# Patient Record
Sex: Female | Born: 1960 | Race: White | Hispanic: No | Marital: Married | Smoking: Former smoker
Health system: Southern US, Community
[De-identification: ages and names within clinical notes are randomized; demographics above are authoritative.]

## PROBLEM LIST (undated history)

## (undated) DIAGNOSIS — R51 Headache: Secondary | ICD-10-CM

## (undated) DIAGNOSIS — E041 Nontoxic single thyroid nodule: Secondary | ICD-10-CM

## (undated) DIAGNOSIS — E7800 Pure hypercholesterolemia, unspecified: Secondary | ICD-10-CM

## (undated) DIAGNOSIS — K259 Gastric ulcer, unspecified as acute or chronic, without hemorrhage or perforation: Secondary | ICD-10-CM

## (undated) DIAGNOSIS — K219 Gastro-esophageal reflux disease without esophagitis: Secondary | ICD-10-CM

## (undated) DIAGNOSIS — Z8719 Personal history of other diseases of the digestive system: Secondary | ICD-10-CM

## (undated) DIAGNOSIS — F419 Anxiety disorder, unspecified: Secondary | ICD-10-CM

## (undated) HISTORY — PX: KYPHOPLASTY: SHX5884

---

## 1999-07-20 ENCOUNTER — Emergency Department (HOSPITAL_COMMUNITY): Admission: EM | Admit: 1999-07-20 | Discharge: 1999-07-20 | Payer: Self-pay | Admitting: Internal Medicine

## 1999-07-20 ENCOUNTER — Encounter: Payer: Self-pay | Admitting: Internal Medicine

## 1999-08-04 ENCOUNTER — Encounter: Payer: Self-pay | Admitting: Internal Medicine

## 1999-08-04 ENCOUNTER — Ambulatory Visit (HOSPITAL_COMMUNITY): Admission: RE | Admit: 1999-08-04 | Discharge: 1999-08-04 | Payer: Self-pay | Admitting: Internal Medicine

## 1999-08-04 ENCOUNTER — Emergency Department (HOSPITAL_COMMUNITY): Admission: EM | Admit: 1999-08-04 | Discharge: 1999-08-04 | Payer: Self-pay | Admitting: Emergency Medicine

## 2000-06-08 ENCOUNTER — Other Ambulatory Visit: Admission: RE | Admit: 2000-06-08 | Discharge: 2000-06-08 | Payer: Self-pay | Admitting: Obstetrics and Gynecology

## 2000-06-09 ENCOUNTER — Other Ambulatory Visit: Admission: RE | Admit: 2000-06-09 | Discharge: 2000-06-09 | Payer: Self-pay | Admitting: Obstetrics and Gynecology

## 2000-06-09 ENCOUNTER — Encounter (INDEPENDENT_AMBULATORY_CARE_PROVIDER_SITE_OTHER): Payer: Self-pay | Admitting: Specialist

## 2000-06-16 ENCOUNTER — Ambulatory Visit (HOSPITAL_COMMUNITY): Admission: RE | Admit: 2000-06-16 | Discharge: 2000-06-16 | Payer: Self-pay | Admitting: Obstetrics and Gynecology

## 2000-06-16 ENCOUNTER — Encounter: Payer: Self-pay | Admitting: Obstetrics and Gynecology

## 2002-12-19 ENCOUNTER — Other Ambulatory Visit: Admission: RE | Admit: 2002-12-19 | Discharge: 2002-12-19 | Payer: Self-pay | Admitting: Obstetrics and Gynecology

## 2005-05-03 ENCOUNTER — Ambulatory Visit (HOSPITAL_COMMUNITY): Admission: RE | Admit: 2005-05-03 | Discharge: 2005-05-03 | Payer: Self-pay | Admitting: Obstetrics and Gynecology

## 2005-08-18 ENCOUNTER — Other Ambulatory Visit: Admission: RE | Admit: 2005-08-18 | Discharge: 2005-08-18 | Payer: Self-pay | Admitting: Obstetrics and Gynecology

## 2005-09-16 ENCOUNTER — Encounter: Admission: RE | Admit: 2005-09-16 | Discharge: 2005-09-16 | Payer: Self-pay | Admitting: Obstetrics and Gynecology

## 2006-03-30 ENCOUNTER — Emergency Department (HOSPITAL_COMMUNITY): Admission: EM | Admit: 2006-03-30 | Discharge: 2006-03-30 | Payer: Self-pay | Admitting: Emergency Medicine

## 2007-09-17 ENCOUNTER — Ambulatory Visit (HOSPITAL_COMMUNITY): Admission: RE | Admit: 2007-09-17 | Discharge: 2007-09-17 | Payer: Self-pay | Admitting: Obstetrics and Gynecology

## 2007-09-27 ENCOUNTER — Encounter: Admission: RE | Admit: 2007-09-27 | Discharge: 2007-09-27 | Payer: Self-pay

## 2007-10-04 ENCOUNTER — Encounter: Admission: RE | Admit: 2007-10-04 | Discharge: 2007-10-04 | Payer: Self-pay | Admitting: Internal Medicine

## 2008-09-26 ENCOUNTER — Encounter: Admission: RE | Admit: 2008-09-26 | Discharge: 2008-09-26 | Payer: Self-pay | Admitting: Internal Medicine

## 2009-08-22 ENCOUNTER — Emergency Department (HOSPITAL_COMMUNITY): Admission: EM | Admit: 2009-08-22 | Discharge: 2009-08-22 | Payer: Self-pay | Admitting: Emergency Medicine

## 2010-06-27 DIAGNOSIS — E041 Nontoxic single thyroid nodule: Secondary | ICD-10-CM

## 2010-06-27 HISTORY — DX: Nontoxic single thyroid nodule: E04.1

## 2010-06-27 HISTORY — PX: OTHER SURGICAL HISTORY: SHX169

## 2010-09-01 ENCOUNTER — Other Ambulatory Visit (INDEPENDENT_AMBULATORY_CARE_PROVIDER_SITE_OTHER): Payer: Self-pay | Admitting: Otolaryngology

## 2010-09-01 DIAGNOSIS — R221 Localized swelling, mass and lump, neck: Secondary | ICD-10-CM

## 2010-09-03 ENCOUNTER — Ambulatory Visit
Admission: RE | Admit: 2010-09-03 | Discharge: 2010-09-03 | Disposition: A | Discharge status: Home / Self Care | Payer: BC Managed Care – PPO | Source: Ambulatory Visit | Attending: Otolaryngology | Admitting: Otolaryngology

## 2010-09-03 DIAGNOSIS — R221 Localized swelling, mass and lump, neck: Secondary | ICD-10-CM

## 2010-09-03 MED ORDER — IOHEXOL 300 MG/ML  SOLN
75.0000 mL | Freq: Once | INTRAMUSCULAR | Status: AC | PRN
  Administered 2010-09-03: 75 mL via INTRAVENOUS

## 2010-11-24 ENCOUNTER — Other Ambulatory Visit (INDEPENDENT_AMBULATORY_CARE_PROVIDER_SITE_OTHER): Payer: Self-pay | Admitting: Otolaryngology

## 2010-11-24 DIAGNOSIS — E041 Nontoxic single thyroid nodule: Secondary | ICD-10-CM

## 2010-12-01 ENCOUNTER — Ambulatory Visit
Admission: RE | Admit: 2010-12-01 | Discharge: 2010-12-01 | Disposition: A | Discharge status: Home / Self Care | Payer: BC Managed Care – PPO | Source: Ambulatory Visit | Attending: Otolaryngology | Admitting: Otolaryngology

## 2010-12-01 DIAGNOSIS — E041 Nontoxic single thyroid nodule: Secondary | ICD-10-CM

## 2011-02-10 ENCOUNTER — Ambulatory Visit (HOSPITAL_BASED_OUTPATIENT_CLINIC_OR_DEPARTMENT_OTHER)
Admission: RE | Admit: 2011-02-10 | Discharge: 2011-02-10 | Disposition: A | Discharge status: Home / Self Care | Payer: BC Managed Care – PPO | Source: Ambulatory Visit | Attending: Urology | Admitting: Urology

## 2011-02-10 DIAGNOSIS — N393 Stress incontinence (female) (male): Secondary | ICD-10-CM | POA: Insufficient documentation

## 2011-02-10 DIAGNOSIS — Z01812 Encounter for preprocedural laboratory examination: Secondary | ICD-10-CM | POA: Insufficient documentation

## 2011-02-10 LAB — CBC
Hemoglobin: 13.1 g/dL (ref 12.0–15.0)
MCHC: 33.5 g/dL (ref 30.0–36.0)
RDW: 13.7 % (ref 11.5–15.5)

## 2011-02-10 LAB — TYPE AND SCREEN
ABO/RH(D): A POS
Antibody Screen: NEGATIVE

## 2011-02-18 NOTE — Op Note (Signed)
  NAMELORREN, ROSSETTI NO.:  [id number]  MEDICAL RECORD NO.:  [id number]  LOCATION:                                 FACILITY:  PHYSICIAN:  Martina Sinner, MD      DATE OF BIRTH:  DATE OF PROCEDURE:  02/10/2011 DATE OF DISCHARGE:                              OPERATIVE REPORT   PREOPERATIVE DIAGNOSIS:  Stress urinary incontinence.  POSTOPERATIVE DIAGNOSIS:  Stress urinary incontinence.  SURGERY:  Sling [facility]) cystourethropexy plus cystoscopy.  Ms. Decamp has stress urinary incontinence affecting quality of life. She consented to the above procedure.  She was given preoperative antibiotics.  Preoperative laboratory tests were normal.  Extra care was taken with leg positioning to minimize the risk compartment syndrome, neuropathy, and DVT.  Two 1 cm incisions were made, 1 fingerbreadth above the symphysis pubis, and a skin fold 1.5 cm lateral to the midline.  I made a 2 cm appropriate depth suburethral incision after instilling 5 cc of lidocaine epinephrine mixture.  I sharply dissected urethrovesical angle bilaterally.  With the bladder emptied, I passed a SPARC needle on top of along the back of symphysis pubis using my box technique.  I delivered the trocars, noted a popping index finger bilaterally.  I cystoscoped the patient.  There was no injury to bladder or urethra. There was excellent blue jets bilaterally.  There was no wiggling of bladder with wiggling of the trocar.  With the bladder emptied, I attached the SPARC sling and brought up to the retropubic space and tensioned over the fat with a moderate-sized Kelly clamp.  I cut below the dots and removed the sheaths.  I was very happy with position and tension of the sling.  There was appropriate hypermobility and no spring back effect.  All incisions were irrigated.  I closed the anterior vaginal wall with running 2-0 Vicryl followed by 2 interrupted sutures.  There was a little bit  of splitting of the incision to the patient's left side near the urethral meatus and this was closed with interrupted 2-0 Vicryl. She had healthy tissue and a nice thick closure.  Sling was cut at the below skin level and closed with 4-0 Vicryl interrupted suture and Dermabond.  Hopefully, this operation will reach Ms. Pollie Friar treatment goal.          ______________________________ Martina Sinner, MD     SAM/MEDQ  D:  02/10/2011  T:  02/11/2011  Job:  [id number]  Electronically Signed by Alfredo Martinez MD on 02/18/2011 08:19:50 AM

## 2011-03-21 ENCOUNTER — Other Ambulatory Visit: Payer: Self-pay | Admitting: Internal Medicine

## 2011-03-21 DIAGNOSIS — E041 Nontoxic single thyroid nodule: Secondary | ICD-10-CM

## 2011-10-31 ENCOUNTER — Ambulatory Visit
Admission: RE | Admit: 2011-10-31 | Discharge: 2011-10-31 | Disposition: A | Discharge status: Home / Self Care | Payer: BC Managed Care – PPO | Source: Ambulatory Visit | Attending: Internal Medicine | Admitting: Internal Medicine

## 2011-10-31 DIAGNOSIS — E041 Nontoxic single thyroid nodule: Secondary | ICD-10-CM

## 2011-11-22 ENCOUNTER — Ambulatory Visit (HOSPITAL_COMMUNITY)
Admission: RE | Admit: 2011-11-22 | Discharge: 2011-11-22 | Disposition: A | Discharge status: Home / Self Care | Payer: BC Managed Care – PPO | Source: Ambulatory Visit | Attending: Gastroenterology | Admitting: Gastroenterology

## 2011-11-22 ENCOUNTER — Encounter (HOSPITAL_COMMUNITY): Payer: Self-pay

## 2011-11-22 HISTORY — DX: Headache: R51

## 2011-11-22 HISTORY — DX: Pure hypercholesterolemia, unspecified: E78.00

## 2011-11-22 HISTORY — DX: Nontoxic single thyroid nodule: E04.1

## 2011-11-22 HISTORY — DX: Personal history of other diseases of the digestive system: Z87.19

## 2011-11-22 HISTORY — DX: Anxiety disorder, unspecified: F41.9

## 2011-11-22 HISTORY — DX: Gastric ulcer, unspecified as acute or chronic, without hemorrhage or perforation: K25.9

## 2011-11-22 HISTORY — DX: Gastro-esophageal reflux disease without esophagitis: K21.9

## 2011-11-22 NOTE — Anesthesia Preprocedure Evaluation (Addendum)
Anesthesia Evaluation    Airway Mallampati: II TM Distance: >3 FB Neck ROM: Full    Dental No notable dental hx.    Pulmonary  breath sounds clear to auscultation  Pulmonary exam normal       Cardiovascular Rhythm:Regular Rate:Normal     Neuro/Psych PSYCHIATRIC DISORDERS Anxiety    GI/Hepatic hiatal hernia, GERD-  Medicated and Controlled,  Endo/Other    Renal/GU      Musculoskeletal   Abdominal   Peds  Hematology   Anesthesia Other Findings   Reproductive/Obstetrics                           Anesthesia Physical Anesthesia Plan  ASA: II  Anesthesia Plan: MAC   Post-op Pain Management:    Induction: Intravenous  Airway Management Planned:   Additional Equipment:   Intra-op Plan:   Post-operative Plan:   Informed Consent: I have reviewed the patients History and Physical, chart, labs and discussed the procedure including the risks, benefits and alternatives for the proposed anesthesia with the patient or authorized representative who has indicated his/her understanding and acceptance.   Dental advisory given  Plan Discussed with:   Anesthesia Plan Comments:         Anesthesia Quick Evaluation

## 2011-11-23 ENCOUNTER — Encounter (HOSPITAL_COMMUNITY): Payer: Self-pay | Admitting: *Deleted

## 2011-11-23 ENCOUNTER — Encounter (HOSPITAL_COMMUNITY): Payer: Self-pay | Admitting: Anesthesiology

## 2011-11-23 ENCOUNTER — Ambulatory Visit (HOSPITAL_COMMUNITY)
Admission: RE | Admit: 2011-11-23 | Discharge: 2011-11-23 | Disposition: A | Discharge status: Home / Self Care | Payer: BC Managed Care – PPO | Source: Ambulatory Visit | Attending: Gastroenterology | Admitting: Gastroenterology

## 2011-11-23 ENCOUNTER — Ambulatory Visit (HOSPITAL_COMMUNITY): Payer: BC Managed Care – PPO | Admitting: Anesthesiology

## 2011-11-23 ENCOUNTER — Encounter (HOSPITAL_COMMUNITY)
Admission: RE | Disposition: A | Discharge status: Home / Self Care | Payer: Self-pay | Source: Ambulatory Visit | Attending: Gastroenterology

## 2011-11-23 DIAGNOSIS — Z860100 Personal history of colon polyps, unspecified: Secondary | ICD-10-CM | POA: Insufficient documentation

## 2011-11-23 DIAGNOSIS — Z09 Encounter for follow-up examination after completed treatment for conditions other than malignant neoplasm: Secondary | ICD-10-CM | POA: Insufficient documentation

## 2011-11-23 HISTORY — PX: HOT HEMOSTASIS: SHX5433

## 2011-11-23 SURGERY — COLONOSCOPY WITH PROPOFOL
Anesthesia: Monitor Anesthesia Care

## 2011-11-23 MED ORDER — PROPOFOL 10 MG/ML IV EMUL
INTRAVENOUS | Status: DC | PRN
  Administered 2011-11-23: 100 ug/kg/min via INTRAVENOUS

## 2011-11-23 MED ORDER — KETAMINE HCL 10 MG/ML IJ SOLN
INTRAMUSCULAR | Status: DC | PRN
  Administered 2011-11-23: 10 mg via INTRAVENOUS

## 2011-11-23 MED ORDER — LACTATED RINGERS IV SOLN
INTRAVENOUS | Status: DC
  Administered 2011-11-23: 1000 mL via INTRAVENOUS

## 2011-11-23 MED ORDER — LACTATED RINGERS IV SOLN
INTRAVENOUS | Status: DC | PRN
  Administered 2011-11-23: 08:00:00 via INTRAVENOUS

## 2011-11-23 MED ORDER — FENTANYL CITRATE 0.05 MG/ML IJ SOLN
INTRAMUSCULAR | Status: DC | PRN
  Administered 2011-11-23: 50 ug via INTRAVENOUS

## 2011-11-23 MED ORDER — MIDAZOLAM HCL 5 MG/5ML IJ SOLN
INTRAMUSCULAR | Status: DC | PRN
  Administered 2011-11-23: 2 mg via INTRAVENOUS

## 2011-11-23 SURGICAL SUPPLY — 22 items

## 2011-11-23 NOTE — Preoperative (Signed)
Beta Blockers   Reason not to administer Beta Blockers:Not Applicable 

## 2011-11-23 NOTE — Anesthesia Postprocedure Evaluation (Signed)
  Anesthesia Post-op Note  Patient: Audrey Serrano  Procedure(s) Performed: Procedure(s) (LRB): COLONOSCOPY WITH PROPOFOL (N/A) HOT HEMOSTASIS (ARGON PLASMA COAGULATION/BICAP) (N/A)  Patient Location: PACU  Anesthesia Type: MAC  Level of Consciousness: awake and alert   Airway and Oxygen Therapy: Patient Spontanous Breathing  Post-op Pain: mild  Post-op Assessment: Post-op Vital signs reviewed, Patient's Cardiovascular Status Stable, Respiratory Function Stable, Patent Airway and No signs of Nausea or vomiting  Post-op Vital Signs: stable  Complications: No apparent anesthesia complications

## 2011-11-23 NOTE — Transfer of Care (Signed)
Immediate Anesthesia Transfer of Care Note  Patient: Audrey Serrano  Procedure(s) Performed: Procedure(s) (LRB): COLONOSCOPY WITH PROPOFOL (N/A) HOT HEMOSTASIS (ARGON PLASMA COAGULATION/BICAP) (N/A)  Patient Location: PACU  Anesthesia Type: MAC  Level of Consciousness: awake, alert , oriented, patient cooperative and responds to stimulation  Airway & Oxygen Therapy: Patient Spontanous Breathing and Patient connected to face mask oxygen  Post-op Assessment: Report given to PACU RN, Post -op Vital signs reviewed and stable and Patient moving all extremities X 4  Post vital signs: Reviewed and stable  Complications: No apparent anesthesia complications

## 2011-11-23 NOTE — H&P (Signed)
Patient interval history reviewed.  Patient examined again.  There has been no change from documented H/P dated 11/22/11 (scanned into chart from our office) except as documented above.  Assessment:  1.  Removal of large adenomatous polyp (ascending colon) during screening colonoscopy November 2012.  Plan:  1.  Colonoscopy (with propofol, for more deep sedation) to reinspect polypectomy site, and perform completion polypectomy and/or argon plasma coagulation (APC) to polypectomy site, as needed. 2.  Risks (bleeding, infection, bowel perforation that could require surgery, sedation-related changes in cardiopulmonary systems), benefits (identification and possible treatment of source of symptoms, exclusion of certain causes of symptoms), and alternatives (watchful waiting, radiographic imaging studies, empiric medical treatment) of colonoscopy with possible polypectomy and/or APC were explained to patient + husband in detail and patient wishes to proceed.

## 2011-11-23 NOTE — Discharge Instructions (Signed)
Colonoscopy ° °Post procedure instructions: ° °Read the instructions outlined below and refer to this sheet in the next few weeks. These discharge instructions provide you with general information on caring for yourself after you leave the hospital. Your doctor may also give you specific instructions. While your treatment has been planned according to the most current medical practices available, unavoidable complications occasionally occur. If you have any problems or questions after discharge, call Dr. Fitzhugh Vizcarrondo at [facility] ([phone]). ° °HOME CARE INSTRUCTIONS ° °ACTIVITY: °· You may resume your regular activity, but move at a slower pace for the next 24 hours.  °· Take frequent rest periods for the next 24 hours.  °· Walking will help get rid of the air and reduce the bloated feeling in your belly (abdomen).  °· No driving for 24 hours (because of the medicine (anesthesia) used during the test).  °· You may shower.  °· Do not sign any important legal documents or operate any machinery for 24 hours (because of the anesthesia used during the test).  °NUTRITION: °· Drink plenty of fluids.  °· You may resume your normal diet as instructed by your doctor.  °· Begin with a light meal and progress to your normal diet. Heavy or fried foods are harder to digest and may make you feel sick to your stomach (nauseated).  °· Avoid alcoholic beverages for 24 hours or as instructed.  °MEDICATIONS: °· You may resume your normal medications unless your doctor tells you otherwise.  °WHAT TO EXPECT TODAY: °· Some feelings of bloating in the abdomen.  °· Passage of more gas than usual.  °· Spotting of blood in your stool or on the toilet paper.  °IF YOU HAD POLYPS REMOVED DURING THE COLONOSCOPY: °· No aspirin products for 7 days or as instructed.  °· No alcohol for 7 days or as instructed.  °· Eat a soft diet for the next 24 hours.  ° °FINDING OUT THE RESULTS OF YOUR TEST ° °Not all test results are available during your  visit. If your test results are not back during the visit, make an appointment with your caregiver to find out the results. Do not assume everything is normal if you have not heard from your caregiver or the medical facility. It is important for you to follow up on all of your test results.  ° ° ° °SEEK IMMEDIATE MEDICAL CARE IF: ° °· You have more than a spotting of blood in your stool.  °· Your belly is swollen (abdominal distention).  °· You are nauseated or vomiting.  °· You have a fever.  °· You have abdominal pain or discomfort that is severe or gets worse throughout the day.  ° ° °Document Released: 01/26/2004 Document Revised: 02/23/2011 Document Reviewed: 01/24/2008 °[employer]® Patient Information ©2012 [employer]. ° °

## 2011-11-23 NOTE — Op Note (Signed)
[facility] [address] [place], [place]  [address]  COLONOSCOPY PROCEDURE REPORT  PATIENT:  Audrey Serrano, Audrey Serrano  MR#:  [id number] BIRTHDATE:  11-01-1960, 51 yrs. old  GENDER:  female ENDOSCOPIST:  Willis Modena, MD REF. BY:  Soyla Murphy. Renne Crigler, M.D. PROCEDURE DATE:  11/23/2011 PROCEDURE:  Colonoscopy 19147 ASA CLASS:  Class II INDICATIONS:  follow-up polypectomy site large polyp (211.3) MEDICATIONS:   MAC sedation, administered by CRNA DESCRIPTION OF PROCEDURE:   After the risks benefits and alternatives of the procedure were thoroughly explained, informed consent was obtained.  Digital rectal exam was performed and revealed no abnormalities.   The Pentax Ped Colon [id number] endoscope was introduced through the anus and advanced to the cecum, which was identified by both the appendix and ileocecal valve, without limitations.  The quality of the prep was good.. The instrument was then slowly withdrawn as the colon was fully examined.<<PROCEDUREIMAGES>>  FINDINGS:  Digital rectal exam normal.  Prep quality good. Ileocecal valve, cecal strap, and appendiceal orifice were normal. Tattoo in ascending colon from prior large polypectomy  site noted; no residual polyp tissue was identified.  No other polyps, masses, vascular ectasias, or inflammatory changes were seen. Normal retroflexed view of rectum.  ENDOSCOPIC IMPRESSION:    1.  Tattoo marking, but no residual polyp tissue seen from prior large polypectomy site in ascending colon. 2.  Otherwise normal colonoscopy.  RECOMMENDATIONS:      1.  Watch for potential complications of procedure. 2.  Repeat colonoscopy in three (3) years for polyp surveillance. 3.  Follow-up with [place] in office on as-needed basis.  REPEAT EXAM:  Yes, three (3) years  ______________________________ Willis Modena  CC:  n. eSIGNEDWillis Modena at 11/23/2011 08:57 AM  Michaelene Song, [phone]

## 2011-11-24 ENCOUNTER — Encounter (HOSPITAL_COMMUNITY): Payer: Self-pay | Admitting: Gastroenterology

## 2012-02-28 ENCOUNTER — Other Ambulatory Visit (HOSPITAL_COMMUNITY): Payer: Self-pay | Admitting: Internal Medicine

## 2012-02-28 DIAGNOSIS — Z1231 Encounter for screening mammogram for malignant neoplasm of breast: Secondary | ICD-10-CM

## 2012-03-06 ENCOUNTER — Ambulatory Visit (HOSPITAL_COMMUNITY)
Admission: RE | Admit: 2012-03-06 | Discharge: 2012-03-06 | Disposition: A | Discharge status: Home / Self Care | Payer: BC Managed Care – PPO | Source: Ambulatory Visit | Attending: Internal Medicine | Admitting: Internal Medicine

## 2012-03-06 DIAGNOSIS — Z1231 Encounter for screening mammogram for malignant neoplasm of breast: Secondary | ICD-10-CM | POA: Insufficient documentation

## 2012-03-20 ENCOUNTER — Other Ambulatory Visit: Payer: Self-pay | Admitting: Gastroenterology

## 2012-03-20 DIAGNOSIS — K769 Liver disease, unspecified: Secondary | ICD-10-CM

## 2012-03-26 ENCOUNTER — Ambulatory Visit
Admission: RE | Admit: 2012-03-26 | Discharge: 2012-03-26 | Disposition: A | Discharge status: Home / Self Care | Payer: BC Managed Care – PPO | Source: Ambulatory Visit | Attending: Gastroenterology | Admitting: Gastroenterology

## 2012-03-26 DIAGNOSIS — K769 Liver disease, unspecified: Secondary | ICD-10-CM

## 2012-03-26 MED ORDER — GADOXETATE DISODIUM 0.25 MMOL/ML IV SOLN
7.0000 mL | Freq: Once | INTRAVENOUS | Status: AC | PRN
  Administered 2012-03-26: 7 mL via INTRAVENOUS

## 2012-05-14 ENCOUNTER — Other Ambulatory Visit: Payer: Self-pay | Admitting: Obstetrics and Gynecology

## 2012-05-14 DIAGNOSIS — N63 Unspecified lump in unspecified breast: Secondary | ICD-10-CM

## 2012-05-17 ENCOUNTER — Other Ambulatory Visit: Payer: Self-pay | Admitting: Interventional Cardiology

## 2012-05-21 ENCOUNTER — Ambulatory Visit
Admission: RE | Admit: 2012-05-21 | Discharge: 2012-05-21 | Disposition: A | Discharge status: Home / Self Care | Payer: BC Managed Care – PPO | Source: Ambulatory Visit | Attending: Obstetrics and Gynecology | Admitting: Obstetrics and Gynecology

## 2012-05-21 DIAGNOSIS — N63 Unspecified lump in unspecified breast: Secondary | ICD-10-CM

## 2012-05-22 ENCOUNTER — Other Ambulatory Visit: Payer: BC Managed Care – PPO

## 2012-08-15 ENCOUNTER — Other Ambulatory Visit: Payer: Self-pay | Admitting: Internal Medicine

## 2012-08-15 DIAGNOSIS — R935 Abnormal findings on diagnostic imaging of other abdominal regions, including retroperitoneum: Secondary | ICD-10-CM

## 2012-08-21 ENCOUNTER — Ambulatory Visit
Admission: RE | Admit: 2012-08-21 | Discharge: 2012-08-21 | Disposition: A | Discharge status: Home / Self Care | Payer: BC Managed Care – PPO | Source: Ambulatory Visit | Attending: Internal Medicine | Admitting: Internal Medicine

## 2012-08-21 DIAGNOSIS — R935 Abnormal findings on diagnostic imaging of other abdominal regions, including retroperitoneum: Secondary | ICD-10-CM

## 2012-08-21 MED ORDER — IOHEXOL 300 MG/ML  SOLN
100.0000 mL | Freq: Once | INTRAMUSCULAR | Status: AC | PRN
  Administered 2012-08-21: 100 mL via INTRAVENOUS

## 2013-07-03 ENCOUNTER — Other Ambulatory Visit (HOSPITAL_COMMUNITY): Payer: Self-pay | Admitting: Obstetrics and Gynecology

## 2013-07-03 ENCOUNTER — Other Ambulatory Visit: Payer: Self-pay | Admitting: Obstetrics and Gynecology

## 2013-07-03 DIAGNOSIS — N63 Unspecified lump in unspecified breast: Secondary | ICD-10-CM

## 2013-07-03 DIAGNOSIS — Z1231 Encounter for screening mammogram for malignant neoplasm of breast: Secondary | ICD-10-CM

## 2013-07-10 ENCOUNTER — Ambulatory Visit
Admission: RE | Admit: 2013-07-10 | Discharge: 2013-07-10 | Disposition: A | Discharge status: Home / Self Care | Payer: BC Managed Care – PPO | Source: Ambulatory Visit | Attending: Obstetrics and Gynecology | Admitting: Obstetrics and Gynecology

## 2013-07-10 DIAGNOSIS — N63 Unspecified lump in unspecified breast: Secondary | ICD-10-CM

## 2015-01-14 ENCOUNTER — Other Ambulatory Visit: Payer: Self-pay | Admitting: Internal Medicine

## 2015-01-14 DIAGNOSIS — R1084 Generalized abdominal pain: Secondary | ICD-10-CM

## 2015-01-15 ENCOUNTER — Ambulatory Visit
Admission: RE | Admit: 2015-01-15 | Discharge: 2015-01-15 | Disposition: A | Discharge status: Home / Self Care | Payer: BLUE CROSS/BLUE SHIELD | Source: Ambulatory Visit | Attending: Internal Medicine | Admitting: Internal Medicine

## 2015-01-15 DIAGNOSIS — R1084 Generalized abdominal pain: Secondary | ICD-10-CM

## 2015-01-19 ENCOUNTER — Other Ambulatory Visit (HOSPITAL_COMMUNITY): Payer: Self-pay | Admitting: Gastroenterology

## 2015-01-19 DIAGNOSIS — R1011 Right upper quadrant pain: Secondary | ICD-10-CM

## 2015-02-02 ENCOUNTER — Ambulatory Visit (HOSPITAL_COMMUNITY)
Admission: RE | Admit: 2015-02-02 | Discharge: 2015-02-02 | Disposition: A | Discharge status: Home / Self Care | Payer: BLUE CROSS/BLUE SHIELD | Source: Ambulatory Visit | Attending: Gastroenterology | Admitting: Gastroenterology

## 2015-02-02 DIAGNOSIS — R109 Unspecified abdominal pain: Secondary | ICD-10-CM | POA: Insufficient documentation

## 2015-02-02 DIAGNOSIS — R1011 Right upper quadrant pain: Secondary | ICD-10-CM

## 2015-02-02 MED ORDER — TECHNETIUM TC 99M MEBROFENIN IV KIT
5.3000 | PACK | Freq: Once | INTRAVENOUS | Status: AC | PRN
  Administered 2015-02-02: 5.3 via INTRAVENOUS

## 2015-07-08 DIAGNOSIS — R319 Hematuria, unspecified: Secondary | ICD-10-CM | POA: Insufficient documentation

## 2016-01-18 DIAGNOSIS — N393 Stress incontinence (female) (male): Secondary | ICD-10-CM | POA: Diagnosis not present

## 2016-01-18 DIAGNOSIS — R351 Nocturia: Secondary | ICD-10-CM | POA: Diagnosis not present

## 2016-02-03 DIAGNOSIS — N3946 Mixed incontinence: Secondary | ICD-10-CM | POA: Diagnosis not present

## 2016-02-03 DIAGNOSIS — R351 Nocturia: Secondary | ICD-10-CM | POA: Diagnosis not present

## 2016-02-04 ENCOUNTER — Emergency Department (HOSPITAL_COMMUNITY): Payer: BLUE CROSS/BLUE SHIELD

## 2016-02-04 ENCOUNTER — Emergency Department (HOSPITAL_COMMUNITY)
Admission: EM | Admit: 2016-02-04 | Discharge: 2016-02-04 | Disposition: A | Discharge status: Home / Self Care | Payer: BLUE CROSS/BLUE SHIELD | Attending: Emergency Medicine | Admitting: Emergency Medicine

## 2016-02-04 ENCOUNTER — Encounter (HOSPITAL_COMMUNITY): Payer: Self-pay | Admitting: Emergency Medicine

## 2016-02-04 DIAGNOSIS — R112 Nausea with vomiting, unspecified: Secondary | ICD-10-CM | POA: Insufficient documentation

## 2016-02-04 DIAGNOSIS — Z791 Long term (current) use of non-steroidal anti-inflammatories (NSAID): Secondary | ICD-10-CM | POA: Diagnosis not present

## 2016-02-04 DIAGNOSIS — I159 Secondary hypertension, unspecified: Secondary | ICD-10-CM | POA: Insufficient documentation

## 2016-02-04 DIAGNOSIS — R42 Dizziness and giddiness: Secondary | ICD-10-CM | POA: Diagnosis not present

## 2016-02-04 DIAGNOSIS — Z79899 Other long term (current) drug therapy: Secondary | ICD-10-CM | POA: Insufficient documentation

## 2016-02-04 DIAGNOSIS — Z87891 Personal history of nicotine dependence: Secondary | ICD-10-CM | POA: Diagnosis not present

## 2016-02-04 DIAGNOSIS — I158 Other secondary hypertension: Secondary | ICD-10-CM | POA: Diagnosis not present

## 2016-02-04 DIAGNOSIS — R51 Headache: Secondary | ICD-10-CM | POA: Insufficient documentation

## 2016-02-04 DIAGNOSIS — M549 Dorsalgia, unspecified: Secondary | ICD-10-CM | POA: Diagnosis not present

## 2016-02-04 DIAGNOSIS — R05 Cough: Secondary | ICD-10-CM | POA: Diagnosis not present

## 2016-02-04 DIAGNOSIS — R1111 Vomiting without nausea: Secondary | ICD-10-CM

## 2016-02-04 DIAGNOSIS — Z7982 Long term (current) use of aspirin: Secondary | ICD-10-CM | POA: Diagnosis not present

## 2016-02-04 DIAGNOSIS — R200 Anesthesia of skin: Secondary | ICD-10-CM | POA: Diagnosis not present

## 2016-02-04 DIAGNOSIS — R0789 Other chest pain: Secondary | ICD-10-CM | POA: Insufficient documentation

## 2016-02-04 DIAGNOSIS — R079 Chest pain, unspecified: Secondary | ICD-10-CM | POA: Diagnosis not present

## 2016-02-04 DIAGNOSIS — R1110 Vomiting, unspecified: Secondary | ICD-10-CM | POA: Diagnosis not present

## 2016-02-04 LAB — BASIC METABOLIC PANEL
Anion gap: 9 (ref 5–15)
BUN: 17 mg/dL (ref 6–20)
CHLORIDE: 104 mmol/L (ref 101–111)
CO2: 24 mmol/L (ref 22–32)
CREATININE: 0.97 mg/dL (ref 0.44–1.00)
Calcium: 9.3 mg/dL (ref 8.9–10.3)
GFR calc Af Amer: >60 mL/min (ref >60)
GFR calc non Af Amer: >60 mL/min (ref >60)
Glucose, Bld: 95 mg/dL (ref 65–99)
Potassium: 4 mmol/L (ref 3.5–5.1)
Sodium: 137 mmol/L (ref 135–145)

## 2016-02-04 LAB — I-STAT TROPONIN, ED
Troponin i, poc: 0 ng/mL (ref 0.00–0.08)
Troponin i, poc: 0 ng/mL (ref 0.00–0.08)

## 2016-02-04 LAB — CBC
HCT: 42.4 % (ref 36.0–46.0)
Hemoglobin: 14 g/dL (ref 12.0–15.0)
MCH: 30.6 pg (ref 26.0–34.0)
MCHC: 33 g/dL (ref 30.0–36.0)
MCV: 92.8 fL (ref 78.0–100.0)
PLATELETS: 360 10*3/uL (ref 150–400)
RBC: 4.57 MIL/uL (ref 3.87–5.11)
RDW: 13.1 % (ref 11.5–15.5)
WBC: 7 10*3/uL (ref 4.0–10.5)

## 2016-02-04 MED ORDER — IOPAMIDOL (ISOVUE-370) INJECTION 76%
100.0000 mL | Freq: Once | INTRAVENOUS | Status: AC | PRN
  Administered 2016-02-04: 100 mL via INTRAVENOUS

## 2016-02-04 NOTE — ED Provider Notes (Signed)
[facility]-EMERGENCY DEPT Provider Note   CSN: [id number] Arrival date & time: 02/04/16  1313  First Provider Contact:  First MD Initiated Contact with Patient 02/04/16 1646        History   Chief Complaint Chief Complaint  Patient presents with  . Chest Pain  . Hypertension    HPI Audrey Serrano is a 55 y.o. female.   Chest Pain   This is a new problem. The current episode started yesterday. The problem occurs constantly. The pain is present in the substernal region. The pain is mild. The quality of the pain is described as dull. The pain radiates to the left shoulder. Duration of episode(s) is 2 days. Associated symptoms include back pain, dizziness, headaches (new kind of headache from prior) and nausea (prior to pain starting). Pertinent negatives include no abdominal pain, no cough, no fever, no palpitations, no shortness of breath, no vomiting and no weakness. She has tried nothing for the symptoms.  Her past medical history is significant for hyperlipidemia and hypertension.  Pertinent negatives for past medical history include no CAD, no COPD, no CHF and no MI.  Hypertension  This is a new problem. Associated symptoms include chest pain and headaches (new kind of headache from prior). Pertinent negatives include no abdominal pain and no shortness of breath.    Past Medical History:  Diagnosis Date  . Anxiety    chronic  . GERD (gastroesophageal reflux disease)   . H/O hiatal hernia   . Headache(784.0)    migraine  . High cholesterol   . Stomach ulcer   . Thyroid nodule 2012   Bilateral    There are no active problems to display for this patient.   Past Surgical History:  Procedure Laterality Date  . biopsy of thyroid nodule  2012  . bladder tack   2012  . CESAREAN SECTION  1994  . HOT HEMOSTASIS  11/23/2011   Procedure: HOT HEMOSTASIS (ARGON PLASMA COAGULATION/BICAP);  Surgeon: Willis ModenaWilliam Outlaw, MD;  Location: [facility];  Service: Endoscopy;  Laterality: N/A;      OB History    No data available       Home Medications    Prior to Admission medications   Medication Sig Start Date End Date Taking? Authorizing Provider  aspirin 81 MG tablet Take 81 mg by mouth at bedtime.    Yes Historical Provider, MD  buPROPion (WELLBUTRIN XL) 150 MG 24 hr tablet Take 150 mg by mouth at bedtime.    Yes Historical Provider, MD  CRESTOR 40 MG tablet Take 20 mg by mouth at bedtime. 01/06/16  Yes Historical Provider, MD  estrogen, conjugated,-medroxyprogesterone (PREMPRO) 0.625-2.5 MG per tablet Take 1 tablet by mouth at bedtime.    Yes Historical Provider, MD  ezetimibe (ZETIA) 10 MG tablet Take 5 mg by mouth at bedtime.   Yes Historical Provider, MD  FLUoxetine (PROZAC) 10 MG capsule Take 10 mg by mouth at bedtime.    Yes Historical Provider, MD  ibuprofen (ADVIL,MOTRIN) 200 MG tablet Take 400 mg by mouth daily as needed for moderate pain or cramping.   Yes Historical Provider, MD  LORazepam (ATIVAN) 1 MG tablet Take 1 mg by mouth daily as needed for anxiety.   Yes Historical Provider, MD  metaxalone (SKELAXIN) 800 MG tablet Take 800 mg by mouth at bedtime. 01/15/16  Yes Historical Provider, MD  pantoprazole (PROTONIX) 40 MG tablet Take 40 mg by mouth at bedtime.    Yes Historical Provider, MD  zolpidem Remus Loffler(AMBIEN)  5 MG tablet Take 2.5 mg by mouth at bedtime as needed for sleep.   Yes Historical Provider, MD    Family History Family History  Problem Relation Age of Onset  . Malignant hyperthermia Neg Hx     Social History Social History  Substance Use Topics  . Smoking status: Former Smoker    Quit date: 06/27/1996  . Smokeless tobacco: Never Used  . Alcohol use Yes     Comment: occassionaly     Allergies   Macrobid [nitrofurantoin] and Other   Review of Systems Review of Systems  Constitutional: Negative for appetite change, chills, fatigue and fever.  HENT: Negative for congestion, ear pain, facial swelling, mouth sores and sore throat.   Eyes:  Negative for visual disturbance.  Respiratory: Negative for cough, chest tightness and shortness of breath.   Cardiovascular: Positive for chest pain. Negative for palpitations.  Gastrointestinal: Positive for nausea (prior to pain starting). Negative for abdominal pain, blood in stool, diarrhea and vomiting.  Endocrine: Negative for cold intolerance and heat intolerance.  Genitourinary: Negative for decreased urine volume, difficulty urinating and frequency.  Musculoskeletal: Positive for back pain. Negative for neck stiffness.  Skin: Negative for rash.  Neurological: Positive for dizziness and headaches (new kind of headache from prior). Negative for weakness and light-headedness.  All other systems reviewed and are negative.    Physical Exam Updated Vital Signs BP 161/86 (BP Location: Left Arm)   Pulse 77   Temp 98.3 F (36.8 C) (Oral)   Resp 20   SpO2 100%   Physical Exam  Constitutional: She is oriented to person, place, and time. She appears well-developed and well-nourished. No distress.  HENT:  Head: Normocephalic and atraumatic.  Right Ear: External ear normal.  Left Ear: External ear normal.  Nose: Nose normal.  Eyes: Conjunctivae and EOM are normal. Pupils are equal, round, and reactive to light. Right eye exhibits no discharge. Left eye exhibits no discharge. No scleral icterus.  Neck: Normal range of motion. Neck supple.  Cardiovascular: Normal rate, regular rhythm and normal heart sounds.  Exam reveals no gallop and no friction rub.   No murmur heard. Pulmonary/Chest: Effort normal and breath sounds normal. No stridor. No respiratory distress. She has no wheezes.  Abdominal: Soft. She exhibits no distension. There is no tenderness.  Musculoskeletal: She exhibits no edema or tenderness.  Neurological: She is alert and oriented to person, place, and time.  Mental Status: Alert and oriented to person, place, and time. Attention and concentration normal. Speech clear.  Recent memory is intac  Cranial Nerves  II Visual Fields: Intact to confrontation. Visual fields intact. III, IV, VI: Pupils equal and reactive to light and near. Full eye movement without nystagmus  V Facial Sensation: Normal. No weakness of masticatory muscles  VII: No facial weakness or asymmetry  VIII Auditory Acuity: Grossly normal  IX/X: The uvula is midline; the palate elevates symmetrically  XI: Normal sternocleidomastoid and trapezius strength  XII: The tongue is midline. No atrophy or fasciculations.   Motor System: Muscle Strength: 5/5 and symmetric in the upper and lower extremities. No pronation or drift.  Muscle Tone: Tone and muscle bulk are normal in the upper and lower extremities.   Reflexes: DTRs: 2+ and symmetrical in all four extremities. Plantar responses are flexor bilaterally.  Coordination: Intact finger-to-nose, heel-to-shin, and rapid alternating movements. No tremor.  Sensation: Intact to light touch, and pinprick. Intact proprioception. Positive Romberg test.  Gait: Routine and tandem gait are normal  Skin: Skin is warm and dry. No rash noted. She is not diaphoretic. No erythema.  Psychiatric: She has a normal mood and affect.     ED Treatments / Results  Labs (all labs ordered are listed, but only abnormal results are displayed) Labs Reviewed  BASIC METABOLIC PANEL  CBC  I-STAT TROPOININ, ED  I-STAT TROPOININ, ED    EKG  EKG Interpretation  Date/Time:  Thursday February 04 2016 17:40:00 EDT Ventricular Rate:  67 PR Interval:    QRS Duration: 116 QT Interval:  403 QTC Calculation: 426 R Axis:   47 Text Interpretation:  Sinus rhythm Nonspecific intraventricular conduction delay Low voltage, precordial leads Confirmed by [facility] MD, Amonie Wisser ([id number]) on 02/04/2016 6:45:53 PM       Radiology Ct Angio Head W Or Wo Contrast  Result Date: 02/04/2016 CLINICAL DATA:  Headaches. Left upper chest pain radiating to the left scapula. Multiple episodes  of emesis recently. Evaluation for vertebral or basilar artery occlusion. EXAM: CT ANGIOGRAPHY HEAD AND NECK TECHNIQUE: Multidetector CT imaging of the head and neck was performed using the standard protocol during bolus administration of intravenous contrast. Multiplanar CT image reconstructions and MIPs were obtained to evaluate the vascular anatomy. Carotid stenosis measurements (when applicable) are obtained utilizing NASCET criteria, using the distal internal carotid diameter as the denominator. CONTRAST:  100 mL Isovue 370 COMPARISON:  Soft tissue neck CT 09/03/2010. Noncontrast head CT 08/22/2009. FINDINGS: CT HEAD Brain: There is no evidence of acute cortical infarct, intracranial hemorrhage, mass, midline shift, or extra-axial fluid collection. The ventricles and sulci are normal. Calvarium and skull base: No acute abnormality. Paranasal sinuses: Clear. Orbits: Unremarkable. CTA NECK Aortic arch: Normal variant aortic arch branching pattern with common origin of the brachiocephalic and left common carotid arteries. Brachiocephalic and subclavian arteries are patent without evidence of significant stenosis, with evaluation of the brachiocephalic artery limited by streak artifact from adjacent venous contrast. Right carotid system: Patent without evidence of stenosis or dissection. Evaluation of the common carotid artery origin is mildly limited by streak artifact. A tiny vessel arises from the proximal ICA. Left carotid system: Patent without evidence of stenosis or dissection. Vertebral arteries: Patent and codominant without evidence of stenosis or dissection. Skeleton: Mild multilevel cervical disc degeneration. Other neck: Incidental 4 mm hypoattenuating left thyroid nodule. Upper chest: Motion artifact through the lung apices. No superior mediastinal mass. CTA HEAD Anterior circulation: Internal carotid arteries are widely patent from skullbase to carotid termini. There is minimal left greater than  right cavernous carotid calcified plaque. ACAs and MCAs are patent without evidence of major branch occlusion or significant proximal stenosis. No intracranial aneurysm is identified. Posterior circulation: Intracranial vertebral arteries are widely patent to the basilar. Left PICA and right AICA origins are patent. Basilar artery is widely patent. SCA origins are patent. There is a large right posterior communicating artery with hypoplastic right P1 segment. PCAs are otherwise unremarkable. A tiny left posterior communicating artery is noted with a small infundibulum at its origin. Venous sinuses: Patent. Anatomic variants: Fetal contribution to the right PCA. Delayed phase: No abnormal enhancement. IMPRESSION: 1. No evidence of major arterial occlusion, dissection, or significant stenosis in the head and neck. 2. Unremarkable CT appearance of the brain. Electronically Signed   By: Sebastian Ache M.D.   On: 02/04/2016 18:46   Dg Chest 2 View  Result Date: 02/04/2016 CLINICAL DATA:  Patient presents for left upper chest pain radiating to left scapula, five episodes of emesis on Saturday. Describes  pain as indigestion. Sent by PCP for hypertension. Patient states left upper chest pain, tightness. No sob, no cough and no congestion. History of hypertension. EXAM: CHEST  2 VIEW COMPARISON:  None. FINDINGS: The heart size and mediastinal contours are within normal limits. Both lungs are clear. No pleural effusion or pneumothorax. The visualized skeletal structures are unremarkable. IMPRESSION: No active cardiopulmonary disease. Electronically Signed   By: Amie Portland M.D.   On: 02/04/2016 14:04   Ct Angio Neck W And/or Wo Contrast  Result Date: 02/04/2016 CLINICAL DATA:  Headaches. Left upper chest pain radiating to the left scapula. Multiple episodes of emesis recently. Evaluation for vertebral or basilar artery occlusion. EXAM: CT ANGIOGRAPHY HEAD AND NECK TECHNIQUE: Multidetector CT imaging of the head and  neck was performed using the standard protocol during bolus administration of intravenous contrast. Multiplanar CT image reconstructions and MIPs were obtained to evaluate the vascular anatomy. Carotid stenosis measurements (when applicable) are obtained utilizing NASCET criteria, using the distal internal carotid diameter as the denominator. CONTRAST:  100 mL Isovue 370 COMPARISON:  Soft tissue neck CT 09/03/2010. Noncontrast head CT 08/22/2009. FINDINGS: CT HEAD Brain: There is no evidence of acute cortical infarct, intracranial hemorrhage, mass, midline shift, or extra-axial fluid collection. The ventricles and sulci are normal. Calvarium and skull base: No acute abnormality. Paranasal sinuses: Clear. Orbits: Unremarkable. CTA NECK Aortic arch: Normal variant aortic arch branching pattern with common origin of the brachiocephalic and left common carotid arteries. Brachiocephalic and subclavian arteries are patent without evidence of significant stenosis, with evaluation of the brachiocephalic artery limited by streak artifact from adjacent venous contrast. Right carotid system: Patent without evidence of stenosis or dissection. Evaluation of the common carotid artery origin is mildly limited by streak artifact. A tiny vessel arises from the proximal ICA. Left carotid system: Patent without evidence of stenosis or dissection. Vertebral arteries: Patent and codominant without evidence of stenosis or dissection. Skeleton: Mild multilevel cervical disc degeneration. Other neck: Incidental 4 mm hypoattenuating left thyroid nodule. Upper chest: Motion artifact through the lung apices. No superior mediastinal mass. CTA HEAD Anterior circulation: Internal carotid arteries are widely patent from skullbase to carotid termini. There is minimal left greater than right cavernous carotid calcified plaque. ACAs and MCAs are patent without evidence of major branch occlusion or significant proximal stenosis. No intracranial  aneurysm is identified. Posterior circulation: Intracranial vertebral arteries are widely patent to the basilar. Left PICA and right AICA origins are patent. Basilar artery is widely patent. SCA origins are patent. There is a large right posterior communicating artery with hypoplastic right P1 segment. PCAs are otherwise unremarkable. A tiny left posterior communicating artery is noted with a small infundibulum at its origin. Venous sinuses: Patent. Anatomic variants: Fetal contribution to the right PCA. Delayed phase: No abnormal enhancement. IMPRESSION: 1. No evidence of major arterial occlusion, dissection, or significant stenosis in the head and neck. 2. Unremarkable CT appearance of the brain. Electronically Signed   By: Sebastian Ache M.D.   On: 02/04/2016 18:46    Procedures Procedures (including critical care time)  Medications Ordered in ED Medications  iopamidol (ISOVUE-370) 76 % injection 100 mL (100 mLs Intravenous Contrast Given 02/04/16 1814)     Initial Impression / Assessment and Plan / ED Course  I have reviewed the triage vital signs and the nursing notes.  Pertinent labs & imaging results that were available during my care of the patient were reviewed by me and considered in my medical decision making (see  chart for details).  Clinical Course    1. Chest pain. Atypical chest pain. EKG with nonspecific T-wave changes. HEAR score of 2. Troponins negative 2. Low suspicion for ACS at this time. The pretest probability for pulmonary embolism. Presentation not classic for aortic dissection. Low suspicion for esophageal perforation at this time given the fact that her episode of emesis was several days ago, chest pain started yesterday, the patient appears nontoxic. Chest x-ray without evidence suggestive of pneumonia, pneumothorax, pneumomediastinum.  No abnormal contour of the mediastinum to suggest dissection. No evidence of acute injuries.  2. Unprovoked emesis Patient denied  any vertiginous symptoms during the episode. She denied any infectious symptoms prior to. She denied any alcohol or illicit drug consumption prior to. She did report being out pain balling prior to the onset Neurologic exam with positive Romberg however intact proprioception. No nystagmus on exam. Rest of the neuro exam was nonfocal. We'll obtain CT angiogram to assess for any vertebrobasilar insufficiency or mass.  CTA head and neck negative. Etiology of patient's emesis unknown at this time however she has been asymptomatic for several days.  3. Headache Different headache than her previous. Neurologic exam as above. We'll obtain CTA as above. No suspicion for IIH. No meningeal symptoms. Imaging negative as above. Likely secondary to possible dehydration or emesis.  4. HTN New per patient as of several days ago. Last time her pressure was check was last year at her yearly physical. Patient was seen at her PCP today and instructed to present to the ED. No evidence of hypertension emergency or crisis. No evidence of end organ damage. We'll defer treatment to primary care provider which she will see Monday.   Patient is safe for discharge with strict return precautions. Patient is to follow-up with her primary care provider for further management of her hypertension.  Final Clinical Impressions(s) / ED Diagnoses   Final diagnoses:  Secondary hypertension, unspecified  Atypical chest pain  Dizziness  Non-intractable vomiting without nausea, vomiting of unspecified type   Disposition: Discharge  Condition: Good  I have discussed the results, Dx and Tx plan with the patient who expressed understanding and agree(s) with the plan. Discharge instructions discussed at great length. The patient was given strict return precautions who verbalized understanding of the instructions. No further questions at time of discharge.    Current Discharge Medication List      Follow Up: Merri Brunette,  MD [address] [place] [address] [place] [address] [phone]  Schedule an appointment as soon as possible for a visit on 02/08/2016 For close follow up and further management of your high blood pressure      Nira Conn, MD 02/04/16 [phone]

## 2016-02-04 NOTE — ED Triage Notes (Signed)
Patient presents for left upper chest pain radiating to left scapula, five episodes of emesis Saturday. Describes pain as indigestion. Sent by PCP for hypertension.

## 2016-03-07 DIAGNOSIS — N3946 Mixed incontinence: Secondary | ICD-10-CM | POA: Diagnosis not present

## 2016-03-07 DIAGNOSIS — R351 Nocturia: Secondary | ICD-10-CM | POA: Diagnosis not present

## 2016-08-09 DIAGNOSIS — R8271 Bacteriuria: Secondary | ICD-10-CM | POA: Diagnosis not present

## 2016-08-09 DIAGNOSIS — R350 Frequency of micturition: Secondary | ICD-10-CM | POA: Diagnosis not present

## 2016-08-09 DIAGNOSIS — N3946 Mixed incontinence: Secondary | ICD-10-CM | POA: Diagnosis not present

## 2016-09-06 DIAGNOSIS — N393 Stress incontinence (female) (male): Secondary | ICD-10-CM | POA: Diagnosis not present

## 2016-09-22 DIAGNOSIS — N3946 Mixed incontinence: Secondary | ICD-10-CM | POA: Diagnosis not present

## 2016-09-22 DIAGNOSIS — R351 Nocturia: Secondary | ICD-10-CM | POA: Diagnosis not present

## 2016-12-14 DIAGNOSIS — M2550 Pain in unspecified joint: Secondary | ICD-10-CM | POA: Diagnosis not present

## 2016-12-14 DIAGNOSIS — M542 Cervicalgia: Secondary | ICD-10-CM | POA: Diagnosis not present

## 2016-12-14 DIAGNOSIS — M47812 Spondylosis without myelopathy or radiculopathy, cervical region: Secondary | ICD-10-CM | POA: Diagnosis not present

## 2016-12-22 DIAGNOSIS — M79643 Pain in unspecified hand: Secondary | ICD-10-CM | POA: Diagnosis not present

## 2016-12-22 DIAGNOSIS — M25561 Pain in right knee: Secondary | ICD-10-CM | POA: Diagnosis not present

## 2016-12-22 DIAGNOSIS — M79671 Pain in right foot: Secondary | ICD-10-CM | POA: Diagnosis not present

## 2016-12-22 DIAGNOSIS — M79672 Pain in left foot: Secondary | ICD-10-CM | POA: Diagnosis not present

## 2016-12-22 DIAGNOSIS — M25562 Pain in left knee: Secondary | ICD-10-CM | POA: Diagnosis not present

## 2016-12-22 DIAGNOSIS — M19041 Primary osteoarthritis, right hand: Secondary | ICD-10-CM | POA: Diagnosis not present

## 2016-12-22 DIAGNOSIS — M25569 Pain in unspecified knee: Secondary | ICD-10-CM | POA: Diagnosis not present

## 2016-12-22 DIAGNOSIS — M25519 Pain in unspecified shoulder: Secondary | ICD-10-CM | POA: Diagnosis not present

## 2016-12-22 DIAGNOSIS — M542 Cervicalgia: Secondary | ICD-10-CM | POA: Diagnosis not present

## 2016-12-22 DIAGNOSIS — M19042 Primary osteoarthritis, left hand: Secondary | ICD-10-CM | POA: Diagnosis not present

## 2017-01-03 DIAGNOSIS — M79602 Pain in left arm: Secondary | ICD-10-CM | POA: Diagnosis not present

## 2017-01-03 DIAGNOSIS — M542 Cervicalgia: Secondary | ICD-10-CM | POA: Diagnosis not present

## 2017-01-04 ENCOUNTER — Ambulatory Visit (INDEPENDENT_AMBULATORY_CARE_PROVIDER_SITE_OTHER): Payer: BLUE CROSS/BLUE SHIELD

## 2017-01-04 ENCOUNTER — Ambulatory Visit (INDEPENDENT_AMBULATORY_CARE_PROVIDER_SITE_OTHER): Payer: BLUE CROSS/BLUE SHIELD | Admitting: Podiatry

## 2017-01-04 ENCOUNTER — Encounter: Payer: Self-pay | Admitting: Podiatry

## 2017-01-04 VITALS — BP 156/81 | HR 68 | Resp 16 | Ht 67.0 in | Wt 181.0 lb

## 2017-01-04 DIAGNOSIS — M216X9 Other acquired deformities of unspecified foot: Secondary | ICD-10-CM | POA: Diagnosis not present

## 2017-01-04 DIAGNOSIS — M2041 Other hammer toe(s) (acquired), right foot: Secondary | ICD-10-CM

## 2017-01-04 DIAGNOSIS — M722 Plantar fascial fibromatosis: Secondary | ICD-10-CM | POA: Diagnosis not present

## 2017-01-04 DIAGNOSIS — M79671 Pain in right foot: Secondary | ICD-10-CM

## 2017-01-04 DIAGNOSIS — M79672 Pain in left foot: Secondary | ICD-10-CM | POA: Diagnosis not present

## 2017-01-04 MED ORDER — TRIAMCINOLONE ACETONIDE 10 MG/ML IJ SUSP
10.0000 mg | Freq: Once | INTRAMUSCULAR | Status: AC
  Administered 2017-01-04: 10 mg

## 2017-01-04 NOTE — Progress Notes (Signed)
   Subjective:    Patient ID: Audrey Serrano, female    DOB: 07/27/1960, 56 y.o.   MRN: [id number]  HPI Chief Complaint  Patient presents with  . Foot Pain    Left foot; bottom of heel & arch; pt stated, "Hurts more in the morning"; x2 yrs  . Toe Pain    Bilateral; 2nd toes going over great toes; x4-5 months      Review of Systems  Cardiovascular: Positive for leg swelling.  Musculoskeletal: Positive for arthralgias, back pain, gait problem and myalgias.  Neurological: Positive for weakness and numbness.  Hematological: Bruises/bleeds easily.  All other systems reviewed and are negative.      Objective:   Physical Exam        Assessment & Plan:

## 2017-01-04 NOTE — Patient Instructions (Signed)

## 2017-01-04 NOTE — Progress Notes (Signed)
Subjective:    Patient ID: Audrey Serrano, female   DOB: 56 y.o.   MRN: [id number]   HPI patient states that over the last 4 months her second toe on her right foot has deviated any significant pattern both medial and dorsal and the joint has become painful and swelling has occurred. Does not remember specific injury and also complains of chronic pain in the plantar heel left that gets worse at different times and makes it hard to walk    Review of Systems  All other systems reviewed and are negative.       Objective:  Physical Exam  Cardiovascular: Intact distal pulses.   Musculoskeletal: Normal range of motion.  Neurological: She is alert.  Skin: Skin is warm.  Nursing note and vitals reviewed.  neurovascular status intact muscle strength adequate range of motion within normal limits with patient found to have exquisite discomfort left plantar fashion at the insertional point of the tendon into the calcaneus with the right second digit found to be medial and dorsally dislocated at the metatarsophalangeal joint with moderate swelling around the joint surface. Patient's noted to have good digital perfusion and is well oriented 3     Assessment:    Probability for flexor plate tear of the right second MPJ spontaneous with complete dislocation of the digit at the MPJ with interphalangeal joint involvement. Acute plantar fasciitis left     Plan:    H&P and both conditions reviewed with patient. Due to the instability of the second MPJ right we discussed surgical intervention and she wants to get this done and wants to do consent form today so she does not have to come back or make another co-pay. I allowed her to read consent form going over alternative treatments complications and the fact there is absolutely no long-term guarantees that we will be able to hold this toe in place and it may end up deviated where it is today or even worse over time. Patient understands this and entirety  signs consent form after extensive review and is dispensed air fracture walker with all instructions on usage. For the left I went ahead and injected the left plantar fashion 3 Milligan Kenalog 5 mill grams Xylocaine and applied fascial brace gave instructions on physical therapy and reappoint to recheck   X-rays indicate significant medial dorsal dislocation second MPJ at the metatarsophalangeal joint and on the left heel noted to have spur formation

## 2017-01-06 ENCOUNTER — Telehealth: Payer: Self-pay | Admitting: *Deleted

## 2017-01-06 NOTE — Telephone Encounter (Signed)
"  I just wanted to check in and see if he has someone to drop out for July 31.  If he has a date come open before I'm scheduled give me a call.  Give me a call back so I can explain it."

## 2017-01-10 DIAGNOSIS — M1990 Unspecified osteoarthritis, unspecified site: Secondary | ICD-10-CM | POA: Diagnosis not present

## 2017-01-10 DIAGNOSIS — M7070 Other bursitis of hip, unspecified hip: Secondary | ICD-10-CM | POA: Diagnosis not present

## 2017-01-10 DIAGNOSIS — M542 Cervicalgia: Secondary | ICD-10-CM | POA: Diagnosis not present

## 2017-01-10 DIAGNOSIS — M25569 Pain in unspecified knee: Secondary | ICD-10-CM | POA: Diagnosis not present

## 2017-01-10 DIAGNOSIS — M79602 Pain in left arm: Secondary | ICD-10-CM | POA: Diagnosis not present

## 2017-01-10 DIAGNOSIS — M79643 Pain in unspecified hand: Secondary | ICD-10-CM | POA: Diagnosis not present

## 2017-01-17 DIAGNOSIS — M79602 Pain in left arm: Secondary | ICD-10-CM | POA: Diagnosis not present

## 2017-01-17 DIAGNOSIS — M542 Cervicalgia: Secondary | ICD-10-CM | POA: Diagnosis not present

## 2017-01-19 DIAGNOSIS — M79602 Pain in left arm: Secondary | ICD-10-CM | POA: Diagnosis not present

## 2017-01-19 DIAGNOSIS — M542 Cervicalgia: Secondary | ICD-10-CM | POA: Diagnosis not present

## 2017-01-19 NOTE — Telephone Encounter (Signed)
I'm calling to let you know that Dr. Charlsie Merlesegal does not have any cancellations for July 31.  "Alright but I'm still scheduled for August 14 right?"  Yes, you are still scheduled for that date.

## 2017-01-24 DIAGNOSIS — M542 Cervicalgia: Secondary | ICD-10-CM | POA: Diagnosis not present

## 2017-01-24 DIAGNOSIS — M79602 Pain in left arm: Secondary | ICD-10-CM | POA: Diagnosis not present

## 2017-01-26 DIAGNOSIS — M79602 Pain in left arm: Secondary | ICD-10-CM | POA: Diagnosis not present

## 2017-01-26 DIAGNOSIS — M542 Cervicalgia: Secondary | ICD-10-CM | POA: Diagnosis not present

## 2017-02-01 DIAGNOSIS — M542 Cervicalgia: Secondary | ICD-10-CM | POA: Diagnosis not present

## 2017-02-01 DIAGNOSIS — M79602 Pain in left arm: Secondary | ICD-10-CM | POA: Diagnosis not present

## 2017-02-07 ENCOUNTER — Encounter: Payer: Self-pay | Admitting: Podiatry

## 2017-02-07 DIAGNOSIS — M21541 Acquired clubfoot, right foot: Secondary | ICD-10-CM | POA: Diagnosis not present

## 2017-02-07 DIAGNOSIS — M216X1 Other acquired deformities of right foot: Secondary | ICD-10-CM | POA: Diagnosis not present

## 2017-02-07 DIAGNOSIS — M2041 Other hammer toe(s) (acquired), right foot: Secondary | ICD-10-CM | POA: Diagnosis not present

## 2017-02-07 DIAGNOSIS — E7800 Pure hypercholesterolemia, unspecified: Secondary | ICD-10-CM | POA: Diagnosis not present

## 2017-02-10 NOTE — Progress Notes (Signed)
DOS 02/07/2017 Metatarsal Osteotomy 2nd RT; Hammertoe Repair 2nd RT with pin

## 2017-02-13 ENCOUNTER — Ambulatory Visit (INDEPENDENT_AMBULATORY_CARE_PROVIDER_SITE_OTHER): Payer: BLUE CROSS/BLUE SHIELD | Admitting: Podiatry

## 2017-02-13 ENCOUNTER — Ambulatory Visit (INDEPENDENT_AMBULATORY_CARE_PROVIDER_SITE_OTHER): Payer: BLUE CROSS/BLUE SHIELD

## 2017-02-13 VITALS — BP 160/93 | HR 75 | Temp 98.6°F

## 2017-02-13 DIAGNOSIS — M722 Plantar fascial fibromatosis: Secondary | ICD-10-CM

## 2017-02-13 DIAGNOSIS — M2041 Other hammer toe(s) (acquired), right foot: Secondary | ICD-10-CM

## 2017-02-13 NOTE — Progress Notes (Signed)
Subjective:    Patient ID: Audrey Serrano, female   DOB: 56 y.o.   MRN: [id number]   HPI patient states she's doing real well with the surgery    ROS      Objective:  Physical Exam neurovascular status intact negative Homans sign noted with second digit in good alignment with pin in place through metatarsal and wound edges well coapted     Assessment:   Doing well post osteotomy first metatarsal right and digital fusion digit 2 right     Plan:   H&P conditions reviewed and x-ray reviewed. At this point sterile dressing reapplied and reappoint in the next several weeks and continue complete immobilization elevation compression and will be seen back earlier if any issues should occur

## 2017-02-14 DIAGNOSIS — M542 Cervicalgia: Secondary | ICD-10-CM | POA: Diagnosis not present

## 2017-02-14 DIAGNOSIS — M79602 Pain in left arm: Secondary | ICD-10-CM | POA: Diagnosis not present

## 2017-02-16 DIAGNOSIS — R110 Nausea: Secondary | ICD-10-CM | POA: Diagnosis not present

## 2017-02-16 DIAGNOSIS — R1110 Vomiting, unspecified: Secondary | ICD-10-CM | POA: Diagnosis not present

## 2017-02-20 DIAGNOSIS — H8113 Benign paroxysmal vertigo, bilateral: Secondary | ICD-10-CM | POA: Diagnosis not present

## 2017-02-21 DIAGNOSIS — E7800 Pure hypercholesterolemia, unspecified: Secondary | ICD-10-CM | POA: Diagnosis not present

## 2017-02-21 DIAGNOSIS — E559 Vitamin D deficiency, unspecified: Secondary | ICD-10-CM | POA: Diagnosis not present

## 2017-02-23 DIAGNOSIS — R42 Dizziness and giddiness: Secondary | ICD-10-CM | POA: Diagnosis not present

## 2017-02-28 DIAGNOSIS — Z0001 Encounter for general adult medical examination with abnormal findings: Secondary | ICD-10-CM | POA: Diagnosis not present

## 2017-02-28 DIAGNOSIS — E237 Disorder of pituitary gland, unspecified: Secondary | ICD-10-CM | POA: Diagnosis not present

## 2017-02-28 DIAGNOSIS — Z8673 Personal history of transient ischemic attack (TIA), and cerebral infarction without residual deficits: Secondary | ICD-10-CM | POA: Diagnosis not present

## 2017-02-28 DIAGNOSIS — R42 Dizziness and giddiness: Secondary | ICD-10-CM | POA: Diagnosis not present

## 2017-03-01 ENCOUNTER — Other Ambulatory Visit: Payer: Self-pay | Admitting: Internal Medicine

## 2017-03-01 DIAGNOSIS — Z8673 Personal history of transient ischemic attack (TIA), and cerebral infarction without residual deficits: Secondary | ICD-10-CM

## 2017-03-01 DIAGNOSIS — E041 Nontoxic single thyroid nodule: Secondary | ICD-10-CM

## 2017-03-06 ENCOUNTER — Ambulatory Visit
Admission: RE | Admit: 2017-03-06 | Discharge: 2017-03-06 | Disposition: A | Discharge status: Home / Self Care | Payer: BLUE CROSS/BLUE SHIELD | Source: Ambulatory Visit | Attending: Internal Medicine | Admitting: Internal Medicine

## 2017-03-06 DIAGNOSIS — Z8673 Personal history of transient ischemic attack (TIA), and cerebral infarction without residual deficits: Secondary | ICD-10-CM

## 2017-03-06 DIAGNOSIS — E041 Nontoxic single thyroid nodule: Secondary | ICD-10-CM

## 2017-03-06 DIAGNOSIS — I6523 Occlusion and stenosis of bilateral carotid arteries: Secondary | ICD-10-CM | POA: Diagnosis not present

## 2017-03-08 DIAGNOSIS — R9089 Other abnormal findings on diagnostic imaging of central nervous system: Secondary | ICD-10-CM | POA: Insufficient documentation

## 2017-03-08 DIAGNOSIS — R42 Dizziness and giddiness: Secondary | ICD-10-CM | POA: Insufficient documentation

## 2017-03-13 ENCOUNTER — Encounter: Payer: Self-pay | Admitting: Podiatry

## 2017-03-13 ENCOUNTER — Ambulatory Visit (INDEPENDENT_AMBULATORY_CARE_PROVIDER_SITE_OTHER): Payer: BLUE CROSS/BLUE SHIELD

## 2017-03-13 ENCOUNTER — Ambulatory Visit (INDEPENDENT_AMBULATORY_CARE_PROVIDER_SITE_OTHER): Payer: BLUE CROSS/BLUE SHIELD | Admitting: Podiatry

## 2017-03-13 DIAGNOSIS — M2041 Other hammer toe(s) (acquired), right foot: Secondary | ICD-10-CM | POA: Diagnosis not present

## 2017-03-14 NOTE — Progress Notes (Signed)
Subjective:    Patient ID: Audrey Serrano, female   DOB: 56 y.o.   MRN: [id number]   HPI patient states she's doing well with her surgery    ROS      Objective:  Physical Exam neurovascular status intact negative Homans sign noted pin intact second digit with good alignment of the second digit     Assessment:   Doing well overall osteotomy digital fusion digit 2 right      Plan:    H&P pin removed sterile dressing applied and x-ray taken and dispensed a BioSkin above ankle brace to control compression and also to bring the second toe in a slightly lateral direction with instructions on usage along with gradual increase in activity and I did dispense a ankle compression stocking. Gradual return soft shoe gear in the next week  X-ray indicates good alignment of the digit with metatarsal with screw in place and no indication of movement

## 2017-03-29 DIAGNOSIS — I6523 Occlusion and stenosis of bilateral carotid arteries: Secondary | ICD-10-CM | POA: Diagnosis not present

## 2017-03-29 DIAGNOSIS — Z23 Encounter for immunization: Secondary | ICD-10-CM | POA: Diagnosis not present

## 2017-03-29 DIAGNOSIS — Z8673 Personal history of transient ischemic attack (TIA), and cerebral infarction without residual deficits: Secondary | ICD-10-CM | POA: Diagnosis not present

## 2017-03-29 DIAGNOSIS — E237 Disorder of pituitary gland, unspecified: Secondary | ICD-10-CM | POA: Diagnosis not present

## 2017-04-03 ENCOUNTER — Ambulatory Visit (INDEPENDENT_AMBULATORY_CARE_PROVIDER_SITE_OTHER): Payer: BLUE CROSS/BLUE SHIELD | Admitting: Podiatry

## 2017-04-03 ENCOUNTER — Ambulatory Visit (INDEPENDENT_AMBULATORY_CARE_PROVIDER_SITE_OTHER): Payer: BLUE CROSS/BLUE SHIELD

## 2017-04-03 DIAGNOSIS — M216X9 Other acquired deformities of unspecified foot: Secondary | ICD-10-CM

## 2017-04-03 DIAGNOSIS — M2041 Other hammer toe(s) (acquired), right foot: Secondary | ICD-10-CM

## 2017-04-03 DIAGNOSIS — R102 Pelvic and perineal pain: Secondary | ICD-10-CM | POA: Insufficient documentation

## 2017-04-03 DIAGNOSIS — B009 Herpesviral infection, unspecified: Secondary | ICD-10-CM | POA: Insufficient documentation

## 2017-04-03 DIAGNOSIS — K259 Gastric ulcer, unspecified as acute or chronic, without hemorrhage or perforation: Secondary | ICD-10-CM | POA: Insufficient documentation

## 2017-04-03 DIAGNOSIS — E7800 Pure hypercholesterolemia, unspecified: Secondary | ICD-10-CM | POA: Diagnosis not present

## 2017-04-03 DIAGNOSIS — G43909 Migraine, unspecified, not intractable, without status migrainosus: Secondary | ICD-10-CM | POA: Insufficient documentation

## 2017-04-03 DIAGNOSIS — N393 Stress incontinence (female) (male): Secondary | ICD-10-CM | POA: Insufficient documentation

## 2017-04-03 DIAGNOSIS — N951 Menopausal and female climacteric states: Secondary | ICD-10-CM | POA: Insufficient documentation

## 2017-04-03 DIAGNOSIS — I10 Essential (primary) hypertension: Secondary | ICD-10-CM | POA: Diagnosis not present

## 2017-04-03 DIAGNOSIS — N841 Polyp of cervix uteri: Secondary | ICD-10-CM | POA: Insufficient documentation

## 2017-04-03 NOTE — Progress Notes (Signed)
Subjective:    Patient ID: Audrey Serrano, female   DOB: 56 y.o.   MRN: [id number]   HPI patient states doing real well with her right foot    ROS      Objective:  Physical Exam neurovascular status intact negative Homans sign was noted with patient second digit good alignment with the patient having mild edema and minimal discomfort with palpation     Assessment:     Doing well post metatarsal osteotomy digital fusion    Plan:    H&P condition reviewed and recommended gradual increase in activity levels with patient to continue to be careful with too much pressure on her foot. Patient be seen back 6 weeks or earlier if needed  X-rays indicate that the osteotomy is healing well and the toes in good alignment

## 2017-04-10 DIAGNOSIS — N390 Urinary tract infection, site not specified: Secondary | ICD-10-CM | POA: Diagnosis not present

## 2017-04-10 DIAGNOSIS — R358 Other polyuria: Secondary | ICD-10-CM | POA: Diagnosis not present

## 2017-04-10 DIAGNOSIS — R300 Dysuria: Secondary | ICD-10-CM | POA: Diagnosis not present

## 2017-05-01 DIAGNOSIS — E7800 Pure hypercholesterolemia, unspecified: Secondary | ICD-10-CM | POA: Diagnosis not present

## 2017-05-08 DIAGNOSIS — I10 Essential (primary) hypertension: Secondary | ICD-10-CM | POA: Diagnosis not present

## 2017-05-08 DIAGNOSIS — E7800 Pure hypercholesterolemia, unspecified: Secondary | ICD-10-CM | POA: Diagnosis not present

## 2017-06-15 DIAGNOSIS — E7800 Pure hypercholesterolemia, unspecified: Secondary | ICD-10-CM | POA: Diagnosis not present

## 2017-06-15 DIAGNOSIS — B029 Zoster without complications: Secondary | ICD-10-CM | POA: Diagnosis not present

## 2017-06-29 DIAGNOSIS — E237 Disorder of pituitary gland, unspecified: Secondary | ICD-10-CM | POA: Diagnosis not present

## 2017-06-29 DIAGNOSIS — I10 Essential (primary) hypertension: Secondary | ICD-10-CM | POA: Diagnosis not present

## 2017-07-06 DIAGNOSIS — E236 Other disorders of pituitary gland: Secondary | ICD-10-CM | POA: Diagnosis not present

## 2017-07-10 DIAGNOSIS — E041 Nontoxic single thyroid nodule: Secondary | ICD-10-CM | POA: Diagnosis not present

## 2017-07-10 DIAGNOSIS — E237 Disorder of pituitary gland, unspecified: Secondary | ICD-10-CM | POA: Diagnosis not present

## 2017-07-11 DIAGNOSIS — E237 Disorder of pituitary gland, unspecified: Secondary | ICD-10-CM | POA: Diagnosis not present

## 2017-08-01 DIAGNOSIS — I10 Essential (primary) hypertension: Secondary | ICD-10-CM | POA: Diagnosis not present

## 2017-08-01 DIAGNOSIS — E7800 Pure hypercholesterolemia, unspecified: Secondary | ICD-10-CM | POA: Diagnosis not present

## 2017-08-22 DIAGNOSIS — E041 Nontoxic single thyroid nodule: Secondary | ICD-10-CM | POA: Diagnosis not present

## 2017-08-22 DIAGNOSIS — E237 Disorder of pituitary gland, unspecified: Secondary | ICD-10-CM | POA: Diagnosis not present

## 2017-08-24 DIAGNOSIS — R300 Dysuria: Secondary | ICD-10-CM | POA: Diagnosis not present

## 2017-08-24 DIAGNOSIS — R358 Other polyuria: Secondary | ICD-10-CM | POA: Diagnosis not present

## 2017-08-24 DIAGNOSIS — N390 Urinary tract infection, site not specified: Secondary | ICD-10-CM | POA: Diagnosis not present

## 2017-10-18 DIAGNOSIS — E041 Nontoxic single thyroid nodule: Secondary | ICD-10-CM | POA: Diagnosis not present

## 2017-10-20 DIAGNOSIS — M542 Cervicalgia: Secondary | ICD-10-CM | POA: Diagnosis not present

## 2017-10-20 DIAGNOSIS — M79643 Pain in unspecified hand: Secondary | ICD-10-CM | POA: Diagnosis not present

## 2017-10-20 DIAGNOSIS — M25519 Pain in unspecified shoulder: Secondary | ICD-10-CM | POA: Diagnosis not present

## 2017-10-20 DIAGNOSIS — M25569 Pain in unspecified knee: Secondary | ICD-10-CM | POA: Diagnosis not present

## 2017-10-25 DIAGNOSIS — M25569 Pain in unspecified knee: Secondary | ICD-10-CM | POA: Diagnosis not present

## 2017-10-25 DIAGNOSIS — M79643 Pain in unspecified hand: Secondary | ICD-10-CM | POA: Diagnosis not present

## 2017-10-25 DIAGNOSIS — M25519 Pain in unspecified shoulder: Secondary | ICD-10-CM | POA: Diagnosis not present

## 2017-10-25 DIAGNOSIS — M542 Cervicalgia: Secondary | ICD-10-CM | POA: Diagnosis not present

## 2017-12-19 DIAGNOSIS — M9901 Segmental and somatic dysfunction of cervical region: Secondary | ICD-10-CM | POA: Diagnosis not present

## 2017-12-19 DIAGNOSIS — M9903 Segmental and somatic dysfunction of lumbar region: Secondary | ICD-10-CM | POA: Diagnosis not present

## 2017-12-19 DIAGNOSIS — M9902 Segmental and somatic dysfunction of thoracic region: Secondary | ICD-10-CM | POA: Diagnosis not present

## 2017-12-20 DIAGNOSIS — M9901 Segmental and somatic dysfunction of cervical region: Secondary | ICD-10-CM | POA: Diagnosis not present

## 2017-12-20 DIAGNOSIS — M9903 Segmental and somatic dysfunction of lumbar region: Secondary | ICD-10-CM | POA: Diagnosis not present

## 2017-12-20 DIAGNOSIS — E041 Nontoxic single thyroid nodule: Secondary | ICD-10-CM | POA: Diagnosis not present

## 2017-12-20 DIAGNOSIS — M9902 Segmental and somatic dysfunction of thoracic region: Secondary | ICD-10-CM | POA: Diagnosis not present

## 2017-12-21 DIAGNOSIS — M9903 Segmental and somatic dysfunction of lumbar region: Secondary | ICD-10-CM | POA: Diagnosis not present

## 2017-12-21 DIAGNOSIS — M9902 Segmental and somatic dysfunction of thoracic region: Secondary | ICD-10-CM | POA: Diagnosis not present

## 2017-12-21 DIAGNOSIS — M9901 Segmental and somatic dysfunction of cervical region: Secondary | ICD-10-CM | POA: Diagnosis not present

## 2018-01-02 DIAGNOSIS — M9901 Segmental and somatic dysfunction of cervical region: Secondary | ICD-10-CM | POA: Diagnosis not present

## 2018-01-02 DIAGNOSIS — M9902 Segmental and somatic dysfunction of thoracic region: Secondary | ICD-10-CM | POA: Diagnosis not present

## 2018-01-02 DIAGNOSIS — M9903 Segmental and somatic dysfunction of lumbar region: Secondary | ICD-10-CM | POA: Diagnosis not present

## 2018-01-03 DIAGNOSIS — M9903 Segmental and somatic dysfunction of lumbar region: Secondary | ICD-10-CM | POA: Diagnosis not present

## 2018-01-03 DIAGNOSIS — M9901 Segmental and somatic dysfunction of cervical region: Secondary | ICD-10-CM | POA: Diagnosis not present

## 2018-01-03 DIAGNOSIS — M9902 Segmental and somatic dysfunction of thoracic region: Secondary | ICD-10-CM | POA: Diagnosis not present

## 2018-01-04 DIAGNOSIS — M9902 Segmental and somatic dysfunction of thoracic region: Secondary | ICD-10-CM | POA: Diagnosis not present

## 2018-01-04 DIAGNOSIS — M9903 Segmental and somatic dysfunction of lumbar region: Secondary | ICD-10-CM | POA: Diagnosis not present

## 2018-01-04 DIAGNOSIS — M9901 Segmental and somatic dysfunction of cervical region: Secondary | ICD-10-CM | POA: Diagnosis not present

## 2018-01-08 DIAGNOSIS — M9903 Segmental and somatic dysfunction of lumbar region: Secondary | ICD-10-CM | POA: Diagnosis not present

## 2018-01-08 DIAGNOSIS — M9902 Segmental and somatic dysfunction of thoracic region: Secondary | ICD-10-CM | POA: Diagnosis not present

## 2018-01-08 DIAGNOSIS — M9901 Segmental and somatic dysfunction of cervical region: Secondary | ICD-10-CM | POA: Diagnosis not present

## 2018-01-09 DIAGNOSIS — M9901 Segmental and somatic dysfunction of cervical region: Secondary | ICD-10-CM | POA: Diagnosis not present

## 2018-01-09 DIAGNOSIS — M9903 Segmental and somatic dysfunction of lumbar region: Secondary | ICD-10-CM | POA: Diagnosis not present

## 2018-01-09 DIAGNOSIS — M9902 Segmental and somatic dysfunction of thoracic region: Secondary | ICD-10-CM | POA: Diagnosis not present

## 2018-01-10 DIAGNOSIS — M9901 Segmental and somatic dysfunction of cervical region: Secondary | ICD-10-CM | POA: Diagnosis not present

## 2018-01-10 DIAGNOSIS — M9902 Segmental and somatic dysfunction of thoracic region: Secondary | ICD-10-CM | POA: Diagnosis not present

## 2018-01-10 DIAGNOSIS — M9903 Segmental and somatic dysfunction of lumbar region: Secondary | ICD-10-CM | POA: Diagnosis not present

## 2018-01-15 DIAGNOSIS — M9903 Segmental and somatic dysfunction of lumbar region: Secondary | ICD-10-CM | POA: Diagnosis not present

## 2018-01-15 DIAGNOSIS — M9902 Segmental and somatic dysfunction of thoracic region: Secondary | ICD-10-CM | POA: Diagnosis not present

## 2018-01-15 DIAGNOSIS — M9901 Segmental and somatic dysfunction of cervical region: Secondary | ICD-10-CM | POA: Diagnosis not present

## 2018-01-16 DIAGNOSIS — M9901 Segmental and somatic dysfunction of cervical region: Secondary | ICD-10-CM | POA: Diagnosis not present

## 2018-01-16 DIAGNOSIS — M9902 Segmental and somatic dysfunction of thoracic region: Secondary | ICD-10-CM | POA: Diagnosis not present

## 2018-01-16 DIAGNOSIS — M9903 Segmental and somatic dysfunction of lumbar region: Secondary | ICD-10-CM | POA: Diagnosis not present

## 2018-01-17 DIAGNOSIS — M9903 Segmental and somatic dysfunction of lumbar region: Secondary | ICD-10-CM | POA: Diagnosis not present

## 2018-01-17 DIAGNOSIS — M9901 Segmental and somatic dysfunction of cervical region: Secondary | ICD-10-CM | POA: Diagnosis not present

## 2018-01-17 DIAGNOSIS — M9902 Segmental and somatic dysfunction of thoracic region: Secondary | ICD-10-CM | POA: Diagnosis not present

## 2018-01-24 DIAGNOSIS — M9901 Segmental and somatic dysfunction of cervical region: Secondary | ICD-10-CM | POA: Diagnosis not present

## 2018-01-24 DIAGNOSIS — M9903 Segmental and somatic dysfunction of lumbar region: Secondary | ICD-10-CM | POA: Diagnosis not present

## 2018-01-24 DIAGNOSIS — M9902 Segmental and somatic dysfunction of thoracic region: Secondary | ICD-10-CM | POA: Diagnosis not present

## 2018-03-05 DIAGNOSIS — Z0001 Encounter for general adult medical examination with abnormal findings: Secondary | ICD-10-CM | POA: Diagnosis not present

## 2018-03-08 DIAGNOSIS — F339 Major depressive disorder, recurrent, unspecified: Secondary | ICD-10-CM | POA: Diagnosis not present

## 2018-03-08 DIAGNOSIS — Z23 Encounter for immunization: Secondary | ICD-10-CM | POA: Diagnosis not present

## 2018-03-08 DIAGNOSIS — Z0001 Encounter for general adult medical examination with abnormal findings: Secondary | ICD-10-CM | POA: Diagnosis not present

## 2018-03-08 DIAGNOSIS — R932 Abnormal findings on diagnostic imaging of liver and biliary tract: Secondary | ICD-10-CM | POA: Diagnosis not present

## 2018-03-08 DIAGNOSIS — I10 Essential (primary) hypertension: Secondary | ICD-10-CM | POA: Diagnosis not present

## 2018-03-08 DIAGNOSIS — Z780 Asymptomatic menopausal state: Secondary | ICD-10-CM | POA: Diagnosis not present

## 2018-03-08 DIAGNOSIS — M8580 Other specified disorders of bone density and structure, unspecified site: Secondary | ICD-10-CM | POA: Diagnosis not present

## 2018-03-19 DIAGNOSIS — Z23 Encounter for immunization: Secondary | ICD-10-CM | POA: Diagnosis not present

## 2018-05-20 IMAGING — CT CT ANGIO NECK
1 of 10 series · 5 of 33 positions shown · IV contrast (isovue)
Comparison: Soft tissue neck CT 09/03/2010. Noncontrast head CT
08/22/2009.

CLINICAL DATA: Headaches. Left upper chest pain radiating to the
left scapula. Multiple episodes of emesis recently. Evaluation for
vertebral or basilar artery occlusion.

EXAM:
CT ANGIOGRAPHY HEAD AND NECK
TECHNIQUE: Multidetector CT imaging of the head and neck was performed using
the standard protocol during bolus administration of intravenous
contrast. Multiplanar CT image reconstructions and MIPs were
obtained to evaluate the vascular anatomy. Carotid stenosis
measurements (when applicable) are obtained utilizing NASCET
criteria, using the distal internal carotid diameter as the
denominator.
CONTRAST:  100 mL Isovue 370

[Series 10: axial thin · axial · 0.39mm/px · z∈[+1338,+1544]mm · 5 of 310 slices shown]
[im 52/310  soft-tissue]
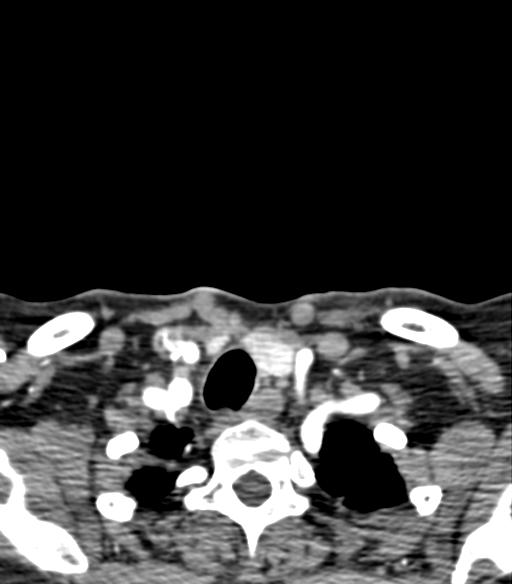
[im 104/310  bone]
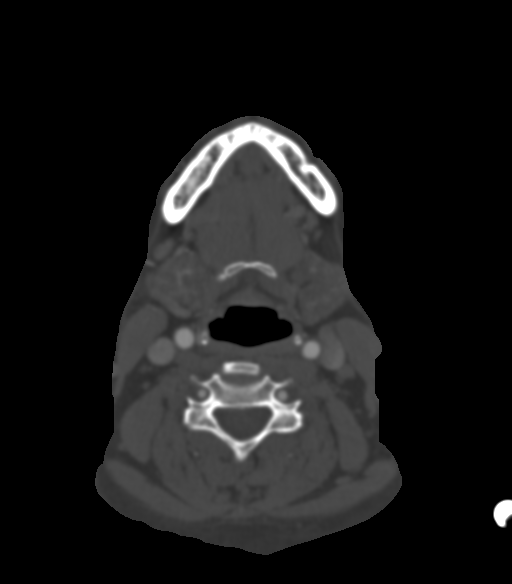
[im 155/310  soft-tissue]
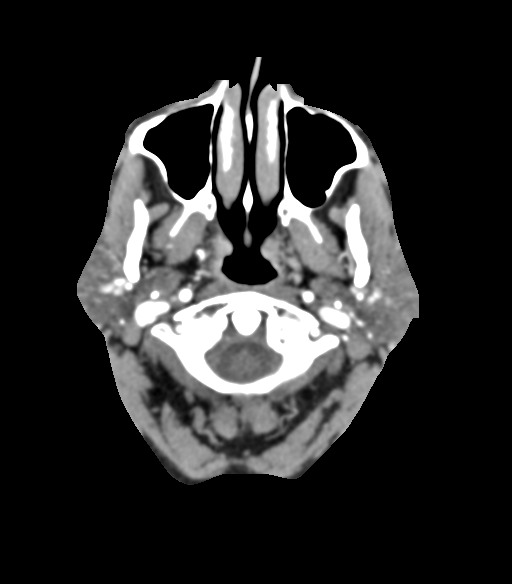
[im 207/310  bone]
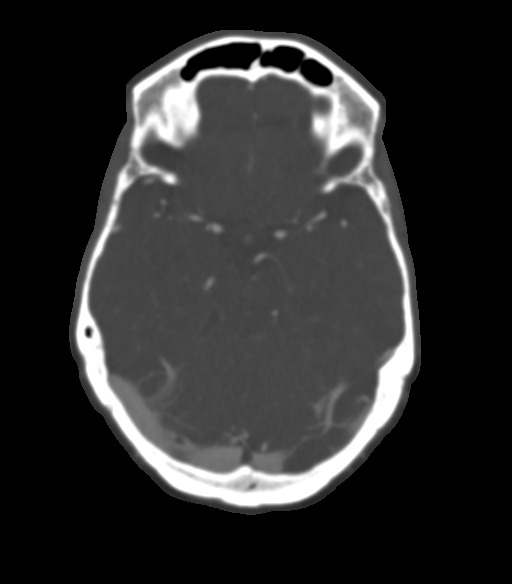
[im 258/310  soft-tissue]
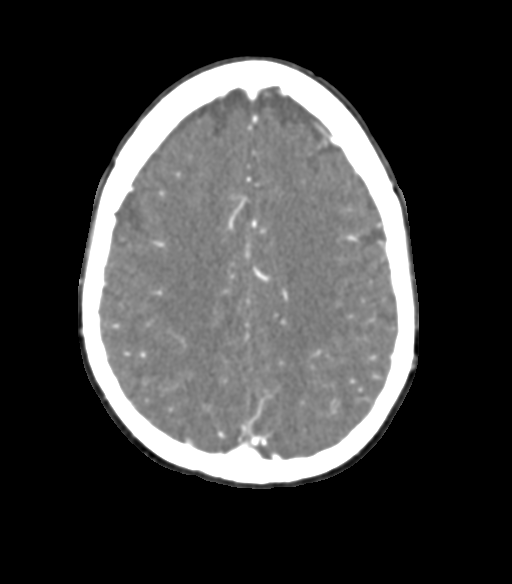

[5 of 33 positions shown; findings below may reference images not displayed]

FINDINGS: CT HEAD

Brain: There is no evidence of acute cortical infarct, intracranial
hemorrhage, mass, midline shift, or extra-axial fluid collection.
The ventricles and sulci are normal.

Calvarium and skull base: No acute abnormality.

Paranasal sinuses: Clear.

Orbits: Unremarkable.

CTA NECK

Aortic arch: Normal variant aortic arch branching pattern with
common origin of the brachiocephalic and left common carotid
arteries. Brachiocephalic and subclavian arteries are patent without
evidence of significant stenosis, with evaluation of the
brachiocephalic artery limited by streak artifact from adjacent
venous contrast.

Right carotid system: Patent without evidence of stenosis or
dissection. Evaluation of the common carotid artery origin is mildly
limited by streak artifact. A tiny vessel arises from the proximal
ICA.

Left carotid system: Patent without evidence of stenosis or
dissection.

Vertebral arteries: Patent and codominant without evidence of
stenosis or dissection.

Skeleton: Mild multilevel cervical disc degeneration.

Other neck: Incidental 4 mm hypoattenuating left thyroid nodule.

Upper chest: Motion artifact through the lung apices. No superior
mediastinal mass.

CTA HEAD

Anterior circulation: Internal carotid arteries are widely patent
from skullbase to carotid termini. There is minimal left greater
than right cavernous carotid calcified plaque. ACAs and MCAs are
patent without evidence of major branch occlusion or significant
proximal stenosis. No intracranial aneurysm is identified.

Posterior circulation: Intracranial vertebral arteries are widely
patent to the basilar. Left PICA and right AICA origins are patent.
Basilar artery is widely patent. SCA origins are patent. There is a
large right posterior communicating artery with hypoplastic right P1
segment. PCAs are otherwise unremarkable. A tiny left posterior
communicating artery is noted with a small infundibulum at its
origin.

Venous sinuses: Patent.

Anatomic variants: Fetal contribution to the right PCA.

Delayed phase: No abnormal enhancement.
IMPRESSION: 1. No evidence of major arterial occlusion, dissection, or
significant stenosis in the head and neck.
2. Unremarkable CT appearance of the brain.

## 2018-05-21 DIAGNOSIS — Z23 Encounter for immunization: Secondary | ICD-10-CM | POA: Diagnosis not present

## 2018-09-23 ENCOUNTER — Emergency Department (HOSPITAL_COMMUNITY): Payer: BLUE CROSS/BLUE SHIELD

## 2018-09-23 ENCOUNTER — Other Ambulatory Visit: Payer: Self-pay

## 2018-09-23 ENCOUNTER — Emergency Department (HOSPITAL_COMMUNITY)
Admission: EM | Admit: 2018-09-23 | Discharge: 2018-09-23 | Disposition: A | Discharge status: Home / Self Care | Payer: BLUE CROSS/BLUE SHIELD | Attending: Emergency Medicine | Admitting: Emergency Medicine

## 2018-09-23 ENCOUNTER — Encounter (HOSPITAL_COMMUNITY): Payer: Self-pay | Admitting: Obstetrics and Gynecology

## 2018-09-23 DIAGNOSIS — M7918 Myalgia, other site: Secondary | ICD-10-CM

## 2018-09-23 DIAGNOSIS — Z87891 Personal history of nicotine dependence: Secondary | ICD-10-CM | POA: Insufficient documentation

## 2018-09-23 DIAGNOSIS — Y939 Activity, unspecified: Secondary | ICD-10-CM | POA: Insufficient documentation

## 2018-09-23 DIAGNOSIS — Z7982 Long term (current) use of aspirin: Secondary | ICD-10-CM | POA: Diagnosis not present

## 2018-09-23 DIAGNOSIS — Y92832 Beach as the place of occurrence of the external cause: Secondary | ICD-10-CM | POA: Diagnosis not present

## 2018-09-23 DIAGNOSIS — M79604 Pain in right leg: Secondary | ICD-10-CM | POA: Diagnosis not present

## 2018-09-23 DIAGNOSIS — M25551 Pain in right hip: Secondary | ICD-10-CM | POA: Diagnosis not present

## 2018-09-23 DIAGNOSIS — Z79899 Other long term (current) drug therapy: Secondary | ICD-10-CM | POA: Diagnosis not present

## 2018-09-23 DIAGNOSIS — W1789XA Other fall from one level to another, initial encounter: Secondary | ICD-10-CM | POA: Diagnosis not present

## 2018-09-23 DIAGNOSIS — S8491XA Injury of unspecified nerve at lower leg level, right leg, initial encounter: Secondary | ICD-10-CM | POA: Diagnosis not present

## 2018-09-23 DIAGNOSIS — Y999 Unspecified external cause status: Secondary | ICD-10-CM | POA: Insufficient documentation

## 2018-09-23 DIAGNOSIS — M79661 Pain in right lower leg: Secondary | ICD-10-CM | POA: Diagnosis not present

## 2018-09-23 MED ORDER — LIDOCAINE 5 % EX PTCH: 1.0000 | MEDICATED_PATCH | CUTANEOUS | 0 refills | Status: AC

## 2018-09-23 MED ORDER — HYDROCODONE-ACETAMINOPHEN 5-325 MG PO TABS
2.0000 | ORAL_TABLET | Freq: Once | ORAL | Status: AC
  Administered 2018-09-23: 2 via ORAL
  Filled 2018-09-23: qty 2

## 2018-09-23 MED ORDER — LIDOCAINE 5 % EX PTCH
1.0000 | MEDICATED_PATCH | CUTANEOUS | Status: DC
  Administered 2018-09-23: 1 via TRANSDERMAL
  Filled 2018-09-23: qty 1

## 2018-09-23 MED ORDER — KETOROLAC TROMETHAMINE 60 MG/2ML IM SOLN: 60.0000 mg | Freq: Once | INTRAMUSCULAR | Status: DC

## 2018-09-23 MED ORDER — HYDROCODONE-ACETAMINOPHEN 5-325 MG PO TABS: 1.0000 | ORAL_TABLET | Freq: Four times a day (QID) | ORAL | 0 refills | Status: DC | PRN

## 2018-09-23 NOTE — Discharge Instructions (Addendum)
I'd recommend using tight-fitting compression pants/short to help with resolution of your thigh/hip hematoma.   Apply warm heat to the area as well to help with this.  Take the meds as prescribed. Use the prescribed or over-the-counter lidocaine patches for the area of pain

## 2018-09-23 NOTE — ED Triage Notes (Signed)
Patient reports about 1830 today she began having pain in her right lower leg. Pt denies hx of blood clots. Pt denies trauma. Pt reports she fell approximately 2 weeks ago

## 2018-09-23 NOTE — ED Provider Notes (Signed)
[facility]-EMERGENCY DEPT Provider Note   CSN: [id number] Arrival date & time: 09/23/18  1858    History   Chief Complaint Chief Complaint  Patient presents with  . Leg Pain    Right    HPI Audrey Serrano is a 58 y.o. female.     HPI   57 yo F with PMHx below here with R leg pain. Pt states that [place] the [place] 2 [place], landing on her R hip. She had significant R hip pain and bruising at the time, which has since resolved. She is not on blood thinners. She states that she was otherwise improving until earlier tonight, when she developed acute onset of severe, sharp, stabbing pain along her right medial leg. She was not sitting with her legs crossed. Denies any redness, pain at the site. No other falls. No fevers. No h/o blood clots, denies any leg swelling or calf tenderness. No CP. No h/o injuries to this area. Pain is constant, no specific exacerbation w/ weight bearing or movement.  Past Medical History:  Diagnosis Date  . Anxiety    chronic  . GERD (gastroesophageal reflux disease)   . H/O hiatal hernia   . Headache(784.0)    migraine  . High cholesterol   . Stomach ulcer   . Thyroid nodule 2012   Bilateral    Patient Active Problem List   Diagnosis Date Noted  . Gastric ulcer 04/03/2017  . Stress incontinence of urine 04/03/2017  . Herpes simplex 04/03/2017  . Hypercholesterolemia 04/03/2017  . Menopausal symptom 04/03/2017  . Migraine 04/03/2017  . Pain in pelvis 04/03/2017  . Polyp of cervix 04/03/2017  . Abnormal brain MRI 03/08/2017  . Vertigo 03/08/2017  . Blood in urine 07/08/2015    Past Surgical History:  Procedure Laterality Date  . biopsy of thyroid nodule  2012  . bladder tack   2012  . CESAREAN SECTION  1994  . HOT HEMOSTASIS  11/23/2011   Procedure: HOT HEMOSTASIS (ARGON PLASMA COAGULATION/BICAP);  Surgeon: Willis Modena, MD;  Location: [facility];  Service: Endoscopy;  Laterality: N/A;     OB History    No obstetric history on file.      Home Medications    Prior to Admission medications   Medication Sig Start Date End Date Taking? Authorizing Provider  aspirin 81 MG tablet Take 81 mg by mouth at bedtime.     [provider]  Azelastine-Fluticasone (DYMISTA) 137-50 MCG/ACT SUSP Dymista 137 mcg-50 mcg/spray nasal spray    [provider]  buPROPion (WELLBUTRIN XL) 150 MG 24 hr tablet Take 300 mg by mouth at bedtime.     [provider]  CRESTOR 40 MG tablet Take 20 mg by mouth at bedtime. 01/06/16   [provider]  ezetimibe (ZETIA) 10 MG tablet Take 5 mg by mouth at bedtime.    [provider]  FLUoxetine (PROZAC) 10 MG capsule Take 20 mg by mouth at bedtime.     [provider]  HYDROcodone-acetaminophen (NORCO/VICODIN) 5-325 MG tablet Take 1-2 tablets by mouth every 6 (six) hours as needed for moderate pain or severe pain. 09/23/18   Shaune Pollack, MD  hyoscyamine (LEVSIN, ANASPAZ) 0.125 MG tablet hyoscyamine sulfate 0.125 mg tablet    [provider]  ibuprofen (ADVIL,MOTRIN) 200 MG tablet Take 400 mg by mouth daily as needed for moderate pain or cramping.    [provider]  lidocaine (LIDODERM) 5 % Place 1 patch onto  the skin daily for 5 days. Remove & Discard patch within 12 hours or as directed by MD 09/23/18 09/28/18  Shaune Pollack, MD  LORazepam (ATIVAN) 1 MG tablet Take 1 mg by mouth daily as needed for anxiety.    [provider]  losartan (COZAAR) 50 MG tablet  02/28/17   [provider]  medroxyPROGESTERone (PROVERA) 5 MG tablet medroxyprogesterone 5 mg tablet  TAKE ONE TABLET BY MOUTH DAILY    [provider]  meloxicam (MOBIC) 15 MG tablet  12/14/16   [provider]  metaxalone (SKELAXIN) 800 MG tablet Take 800 mg by mouth at bedtime. 01/15/16   [provider]  oxybutynin (DITROPAN-XL) 10 MG 24 hr tablet oxybutynin chloride ER 10 mg tablet,extended release 24 hr     [provider]  pantoprazole (PROTONIX) 40 MG tablet Take 40 mg by mouth at bedtime.     [provider]  zolpidem (AMBIEN) 5 MG tablet Take 2.5 mg by mouth at bedtime as needed for sleep.    [provider]    Family History Family History  Problem Relation Age of Onset  . Malignant hyperthermia Neg Hx     Social History Social History   Tobacco Use  . Smoking status: Former Smoker    Last attempt to quit: 06/27/1996    Years since quitting: 22.2  . Smokeless tobacco: Never Used  Substance Use Topics  . Alcohol use: Yes    Comment: occassionaly  . Drug use: No     Allergies   Antihistamines, chlorpheniramine-type; Macrobid [nitrofurantoin]; Other; Prednisone; and Shellfish allergy   Review of Systems Review of Systems  Constitutional: Negative for chills and fever.  HENT: Negative for congestion, rhinorrhea and sore throat.   Eyes: Negative for visual disturbance.  Respiratory: Negative for cough, shortness of breath and wheezing.   Cardiovascular: Negative for chest pain and leg swelling.  Gastrointestinal: Negative for abdominal pain, diarrhea, nausea and vomiting.  Genitourinary: Negative for dysuria, flank pain, vaginal bleeding and vaginal discharge.  Musculoskeletal: Positive for arthralgias and myalgias. Negative for neck pain.  Skin: Negative for rash and wound.  Allergic/Immunologic: Negative for immunocompromised state.  Neurological: Negative for syncope, weakness, numbness and headaches.  Hematological: Does not bruise/bleed easily.  All other systems reviewed and are negative.    Physical Exam Updated Vital Signs BP (!) 153/83 (BP Location: Right Arm)   Pulse 78   Temp 98.7 F (37.1 C) (Oral)   Resp 20   SpO2 97%   Physical Exam Vitals signs and nursing note reviewed.  Constitutional:      General: She is not in acute distress.    Appearance: She is well-developed.  HENT:     Head: Normocephalic and atraumatic.   Eyes:     Conjunctiva/sclera: Conjunctivae normal.  Neck:     Musculoskeletal: Neck supple.  Cardiovascular:     Rate and Rhythm: Normal rate and regular rhythm.     Heart sounds: Normal heart sounds. No murmur. No friction rub.  Pulmonary:     Effort: Pulmonary effort is normal. No respiratory distress.     Breath sounds: Normal breath sounds. No wheezing or rales.  Abdominal:     General: There is no distension.     Palpations: Abdomen is soft.     Tenderness: There is no abdominal tenderness.  Skin:    General: Skin is warm.     Capillary Refill: Capillary refill takes less than 2 seconds.  Neurological:  Mental Status: She is alert and oriented to person, place, and time.     Motor: No abnormal muscle tone.     LOWER EXTREMITY EXAM: RIGHT  INSPECTION & PALPATION: No swelling or bruising. No red skin lesions or rash. Mild TTP over lower anterolateral thigh. No calf TTP, no popliteal tenderness.   SENSORY: sensation is intact to light touch in:  Superficial peroneal nerve distribution (over dorsum of foot) Deep peroneal nerve distribution (over first dorsal web space) Sural nerve distribution (over lateral aspect 5th metatarsal) Saphenous nerve distribution (over medial instep)  MOTOR:  + Motor EHL (great toe dorsiflexion) + FHL (great toe plantar flexion)  + TA (ankle dorsiflexion)  + GSC (ankle plantar flexion)  VASCULAR: 2+ dorsalis pedis and posterior tibialis pulses Capillary refill < 2 sec, toes warm and well-perfused  COMPARTMENTS: Soft, warm, well-perfused No pain with passive extension No parethesias   ED Treatments / Results  Labs (all labs ordered are listed, but only abnormal results are displayed) Labs Reviewed - No data to display  EKG None  Radiology Dg Tibia/fibula Right  Result Date: 09/23/2018 CLINICAL DATA:  58 year old female with right hip and right lower extremity pain. EXAM: DG HIP (WITH OR WITHOUT PELVIS) 2-3V RIGHT; RIGHT  TIBIA AND FIBULA - 2 VIEW COMPARISON:  Knee radiograph dated 12/22/2016 FINDINGS: There is no acute fracture or dislocation. The bones are mildly osteopenic. Mild bilateral hip arthritic changes. The soft tissues are unremarkable. IMPRESSION: No acute fracture or dislocation. Electronically Signed   By: Elgie Collard M.D.   On: 09/23/2018 19:49   Dg Hip Unilat W Or Wo Pelvis 2-3 Views Right  Result Date: 09/23/2018 CLINICAL DATA:  58 year old female with right hip and right lower extremity pain. EXAM: DG HIP (WITH OR WITHOUT PELVIS) 2-3V RIGHT; RIGHT TIBIA AND FIBULA - 2 VIEW COMPARISON:  Knee radiograph dated 12/22/2016 FINDINGS: There is no acute fracture or dislocation. The bones are mildly osteopenic. Mild bilateral hip arthritic changes. The soft tissues are unremarkable. IMPRESSION: No acute fracture or dislocation. Electronically Signed   By: Elgie Collard M.D.   On: 09/23/2018 19:49    Procedures Procedures (including critical care time)  Medications Ordered in ED Medications  lidocaine (LIDODERM) 5 % 1 patch (1 patch Transdermal Patch Applied 09/23/18 2017)  HYDROcodone-acetaminophen (NORCO/VICODIN) 5-325 MG per tablet 2 tablet (2 tablets Oral Given 09/23/18 1930)     Initial Impression / Assessment and Plan / ED Course  I have reviewed the triage vital signs and the nursing notes.  Pertinent labs & imaging results that were available during my care of the patient were reviewed by me and considered in my medical decision making (see chart for details).        58 yo F with PMHx as above here with severe pain right anterolateral shin. No redness, warmth, skin lesions or signs of cellulitis, abscess, zoster. Imaging neg for bony abnormality. Pt does have palpable hematoma to R anterior thigh from trauma 2 weeks ago. I suspect this is transient neuropreaxia 2/2 her traumatic hematoma. Hematoma resolving in size, doubt active bleed. No back pain or signs of radiculopathy. No calf  TTP, asymmetry, swelling, immobilization, or signs of DVT. Given h/o peptic ulcer, will tx with analgesics (not NSAIDs), topical patches, compression to hematoma and outpt follow-up.   Final Clinical Impressions(s) / ED Diagnoses   Final diagnoses:  Neurapraxia of right lower extremity, initial encounter  Musculoskeletal pain    ED Discharge Orders  Ordered    lidocaine (LIDODERM) 5 %  Every 24 hours     09/23/18 2035    HYDROcodone-acetaminophen (NORCO/VICODIN) 5-325 MG tablet  Every 6 hours PRN     09/23/18 2035           Shaune Pollack, MD 09/23/18 2045

## 2018-12-19 DIAGNOSIS — M79643 Pain in unspecified hand: Secondary | ICD-10-CM | POA: Diagnosis not present

## 2018-12-19 DIAGNOSIS — M25519 Pain in unspecified shoulder: Secondary | ICD-10-CM | POA: Diagnosis not present

## 2018-12-19 DIAGNOSIS — M542 Cervicalgia: Secondary | ICD-10-CM | POA: Diagnosis not present

## 2018-12-19 DIAGNOSIS — M25569 Pain in unspecified knee: Secondary | ICD-10-CM | POA: Diagnosis not present

## 2018-12-21 IMAGING — US US CAROTID DUPLEX BILAT
1 series · 13 of 24 positions shown · non-contrast
Comparison: None.

CLINICAL DATA: Dizziness, headaches, hypertension

EXAM:
BILATERAL CAROTID DUPLEX ULTRASOUND
TECHNIQUE: Gray scale imaging, color Doppler and duplex ultrasound were
performed of bilateral carotid and vertebral arteries in the neck.

[Series 1: us carotid duplex bilat · 0.06mm/px · 13 of 62 slices shown]
[im 1/62]
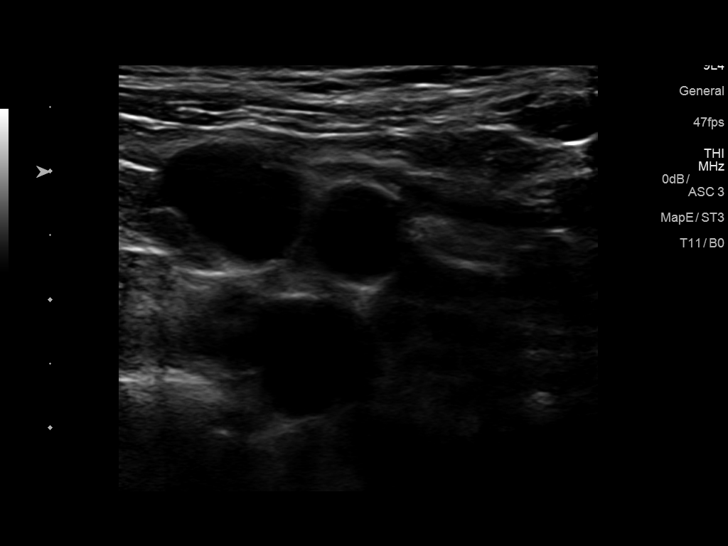
[im 6/62]
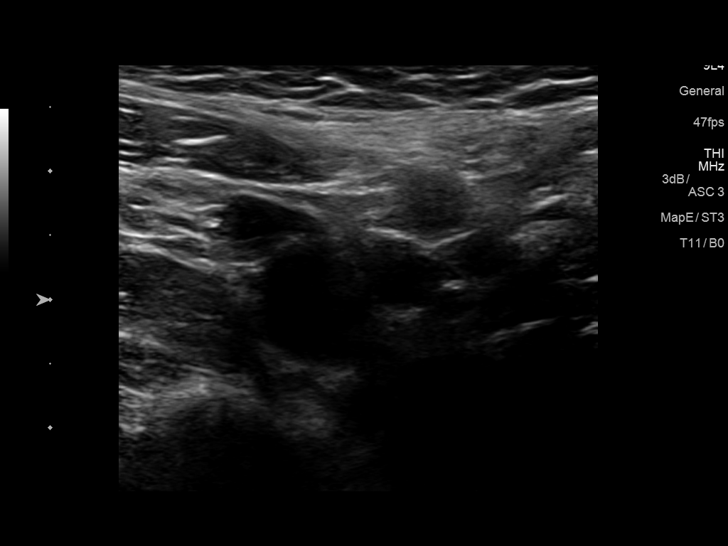
[im 11/62]
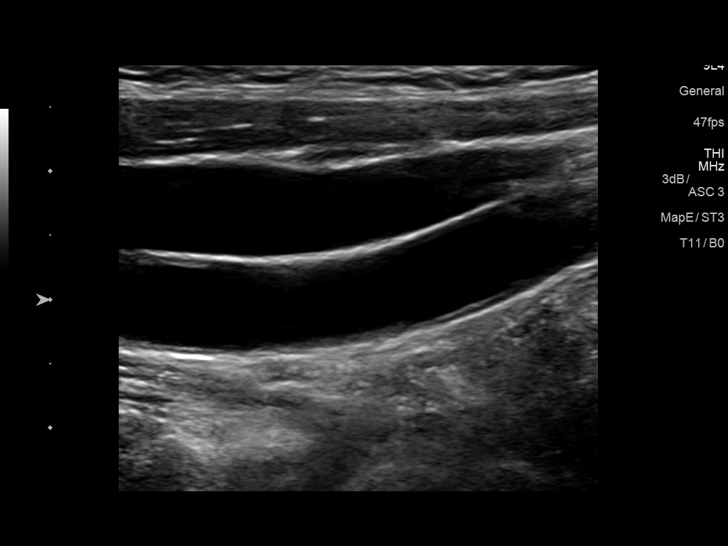
[im 16/62]
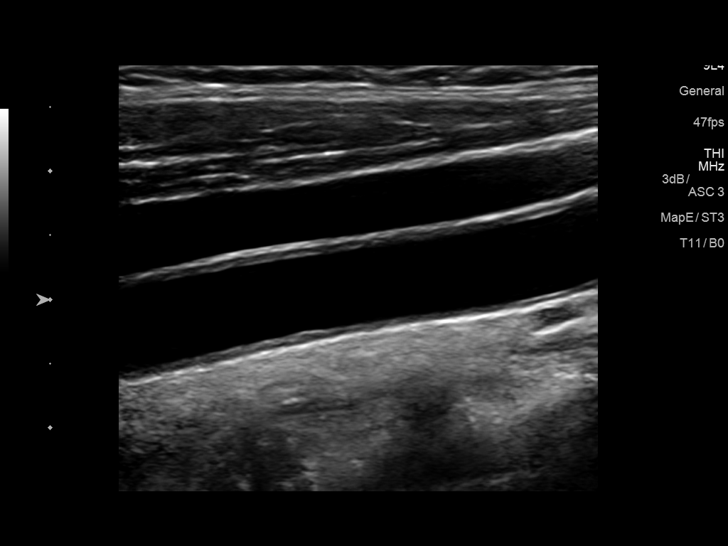
[im 22/62]
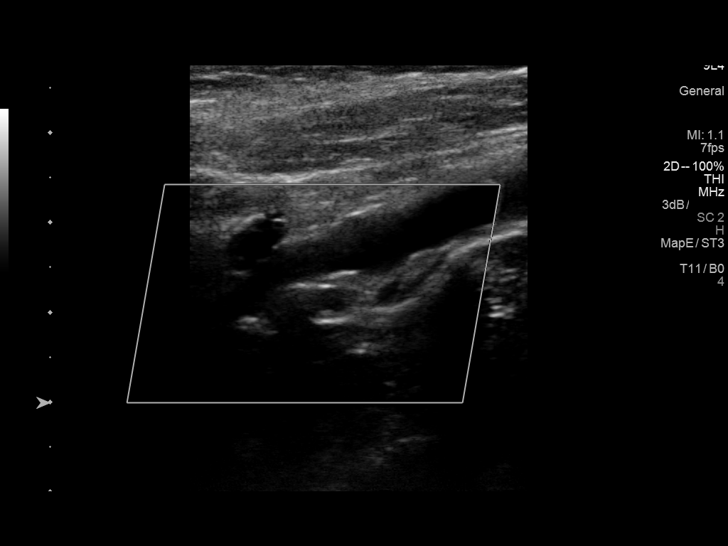
[im 27/62]
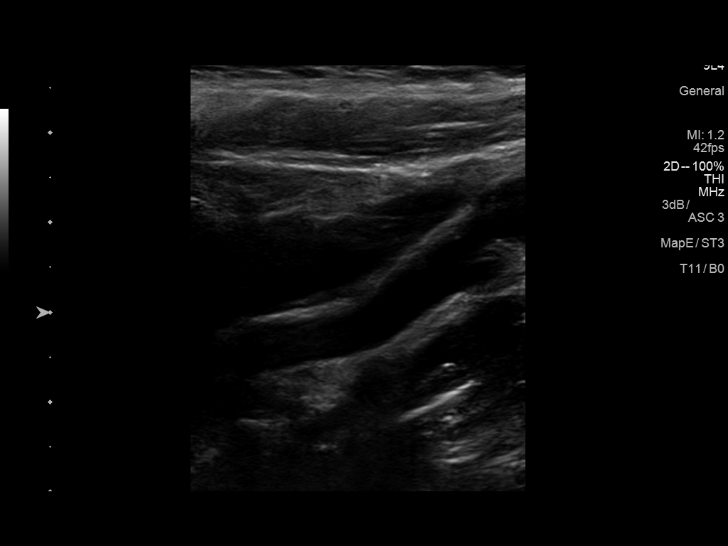
[im 32/62]
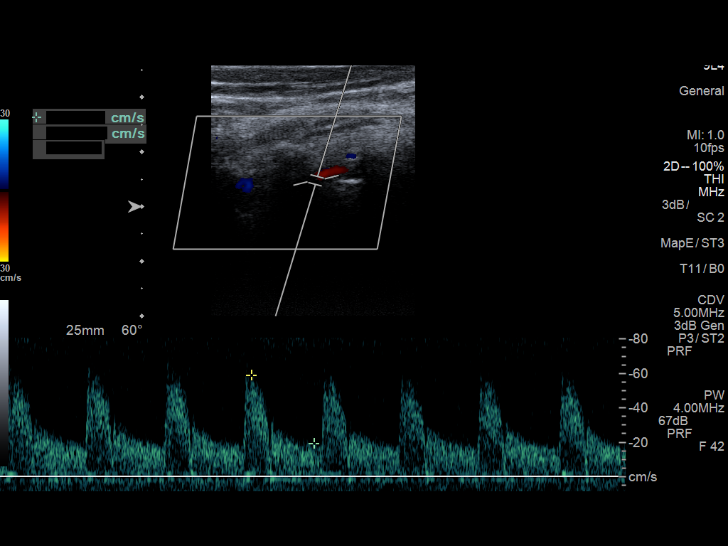
[im 35/62]
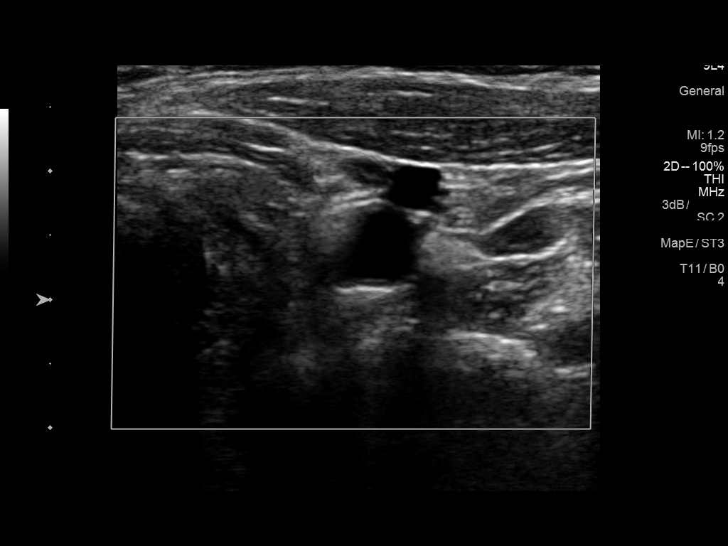
[im 40/62]
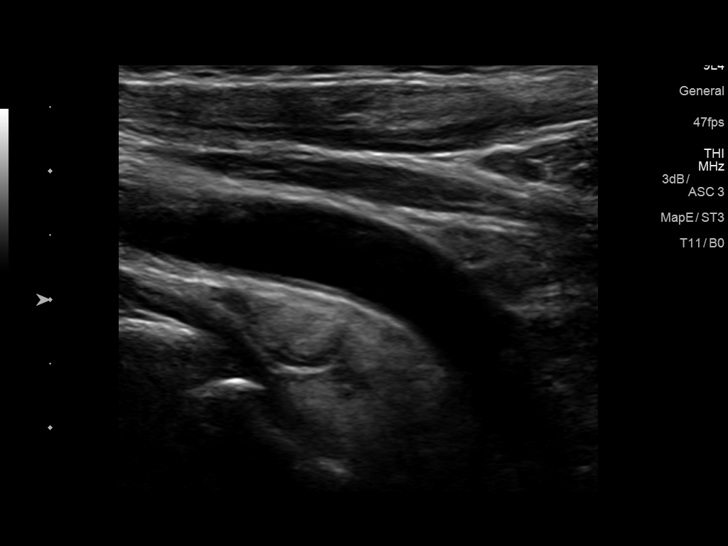
[im 46/62]
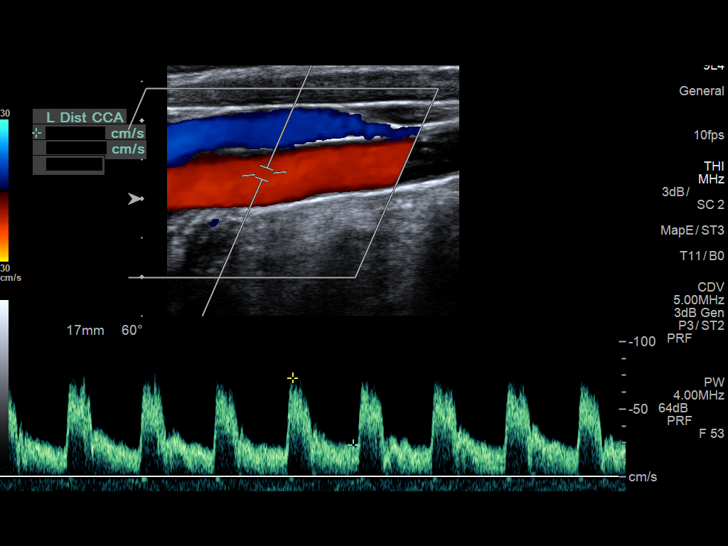
[im 51/62]
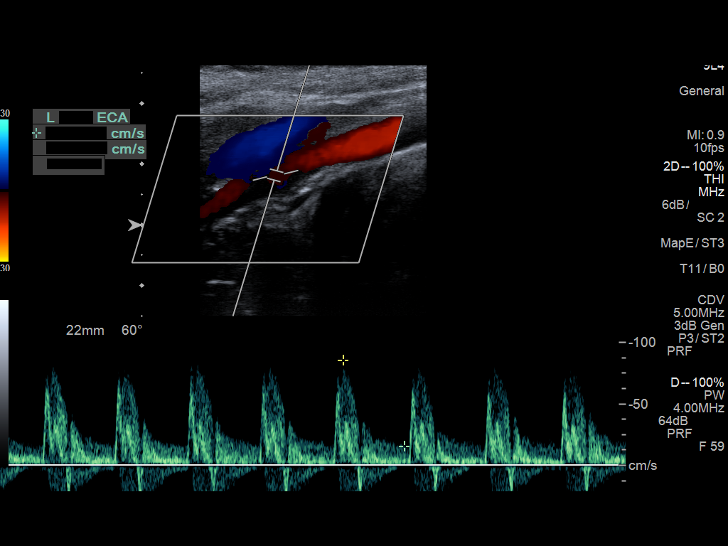
[im 56/62]
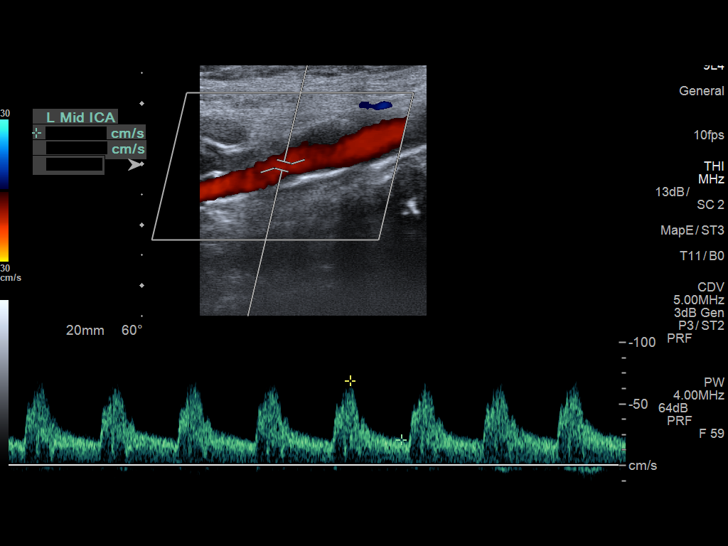
[im 62/62]
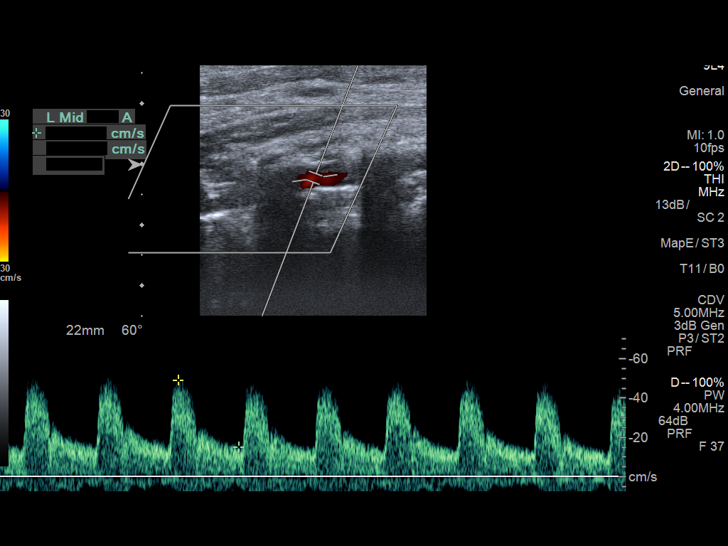

[13 of 24 positions shown; findings below may reference images not displayed]

FINDINGS: Criteria: Quantification of carotid stenosis is based on velocity
parameters that correlate the residual internal carotid diameter
with NASCET-based stenosis levels, using the diameter of the distal
internal carotid lumen as the denominator for stenosis measurement.

The following velocity measurements were obtained:

RIGHT

ICA:  85/31 cm/sec

CCA:  147/17 cm/sec

SYSTOLIC ICA/CCA RATIO:

DIASTOLIC ICA/CCA RATIO:

ECA:  65 cm/sec

LEFT

ICA:  88/33 cm/sec

CCA:  96/8 cm/sec

SYSTOLIC ICA/CCA RATIO:

DIASTOLIC ICA/CCA RATIO:

ECA:  86 cm/sec

RIGHT CAROTID ARTERY: Mild intimal thickening and minor
atherosclerotic change. No hemodynamically significant right ICA
stenosis, velocity elevation, or turbulent flow.

RIGHT VERTEBRAL ARTERY:  Antegrade

LEFT CAROTID ARTERY: Similar minor intimal thickening and
atherosclerotic change. No hemodynamically significant left ICA
stenosis, velocity elevation, or turbulent flow. Degree of narrowing
also less than 50% by ultrasound criteria.

LEFT VERTEBRAL ARTERY:  Antegrade
IMPRESSION: Minor carotid intimal thickening and atherosclerosis. No
hemodynamically significant ICA stenosis by ultrasound criteria.
Degree of narrowing less than 50% bilaterally.

Patent antegrade vertebral flow bilaterally

## 2018-12-21 IMAGING — US US THYROID
1 series · 13 of 25 positions shown · non-contrast
Comparison: 02/04/2016

CLINICAL DATA: Left thyroid nodule by CT

EXAM:
THYROID ULTRASOUND
TECHNIQUE: Ultrasound examination of the thyroid gland and adjacent soft
tissues was performed.

[Series 1: us thyroid · 0.06mm/px · 13 of 53 slices shown]
[im 1/53]
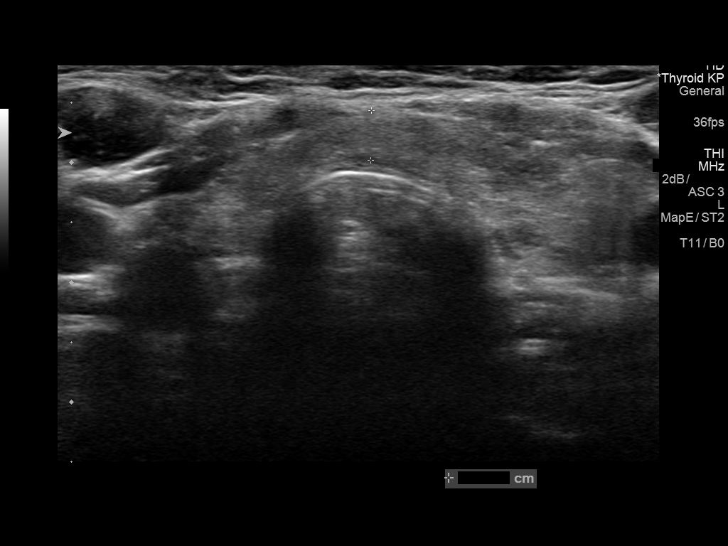
[im 5/53]
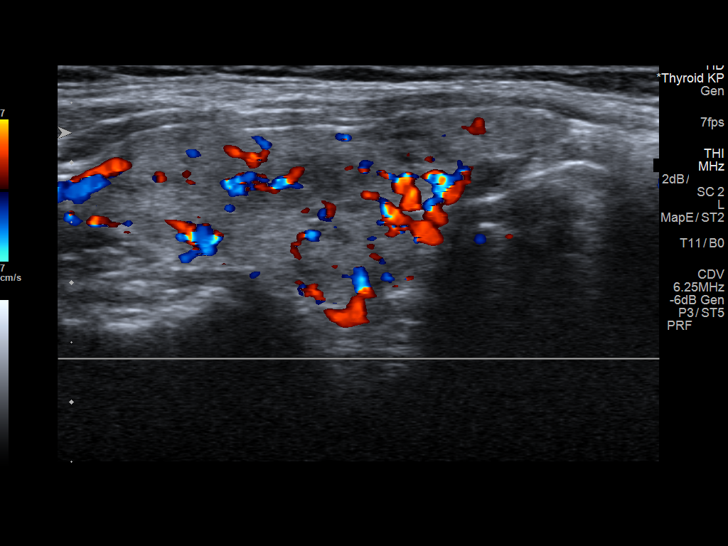
[im 9/53]
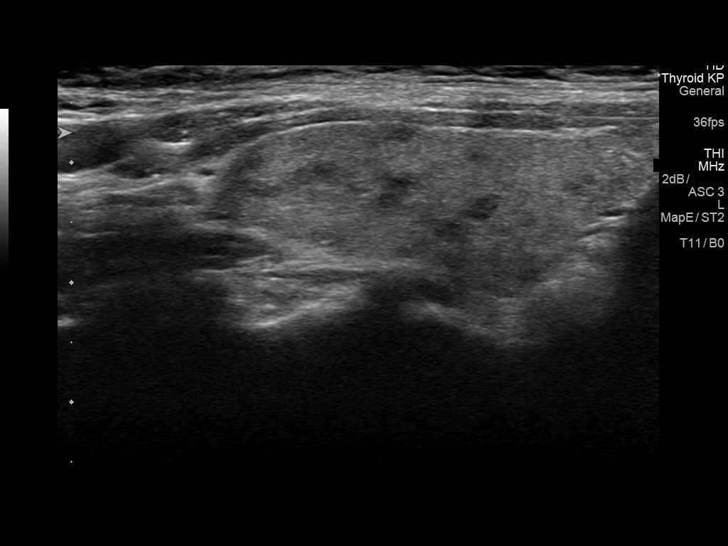
[im 14/53]
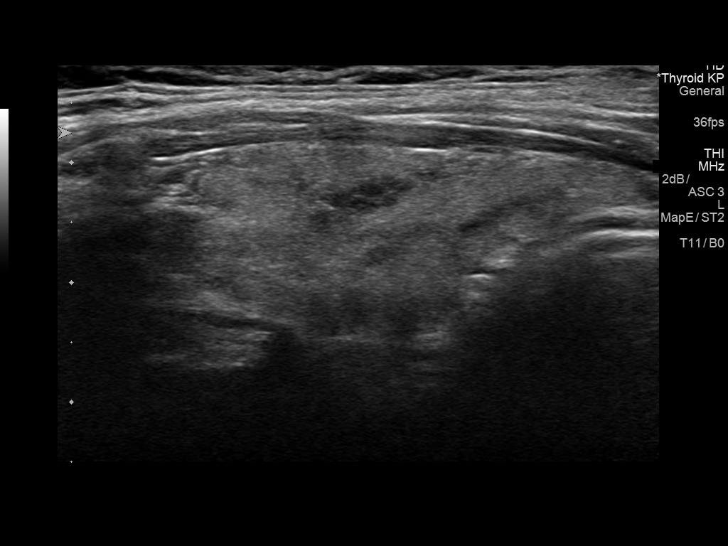
[im 18/53]
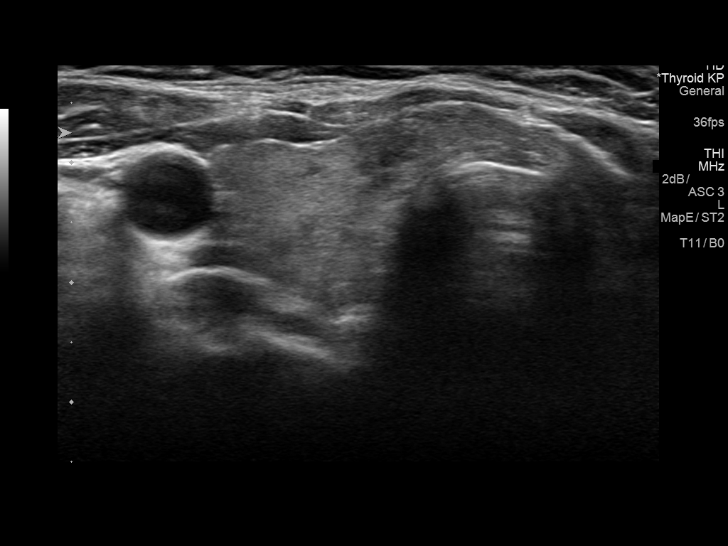
[im 22/53]
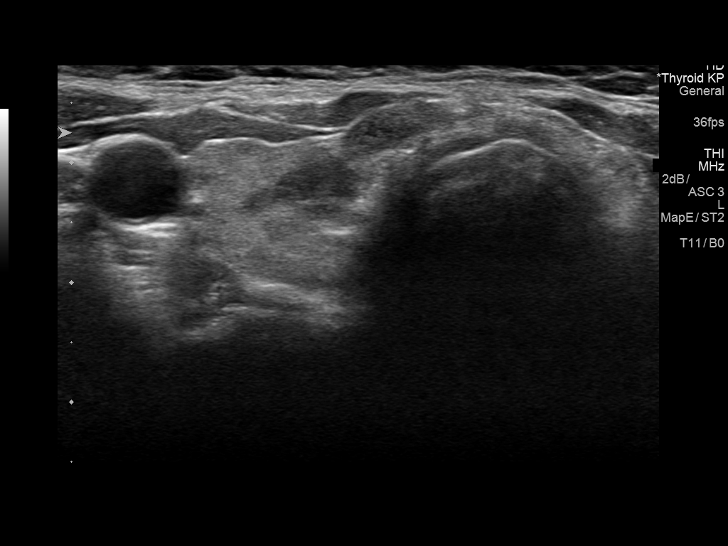
[im 27/53]
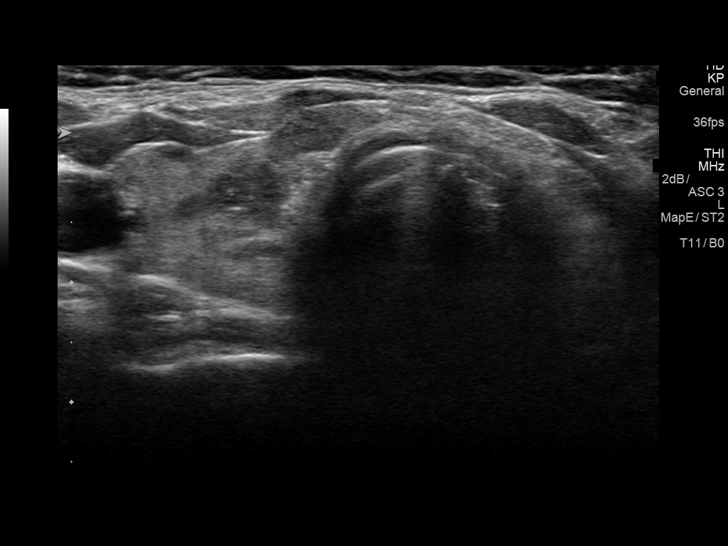
[im 31/53]
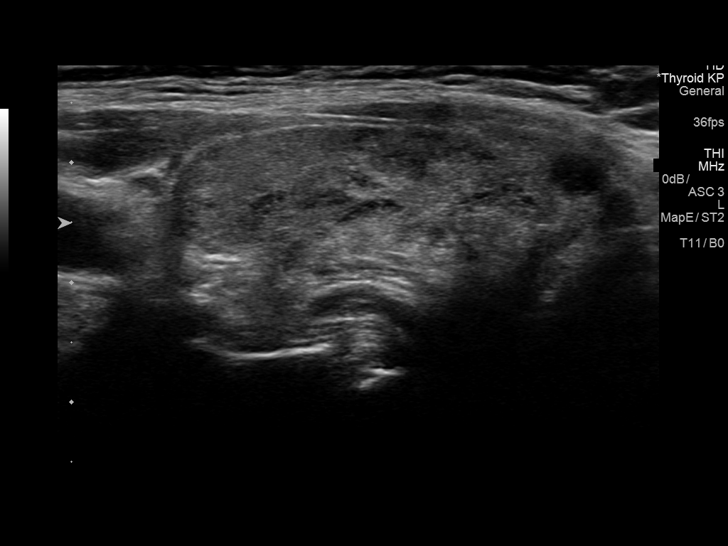
[im 35/53]
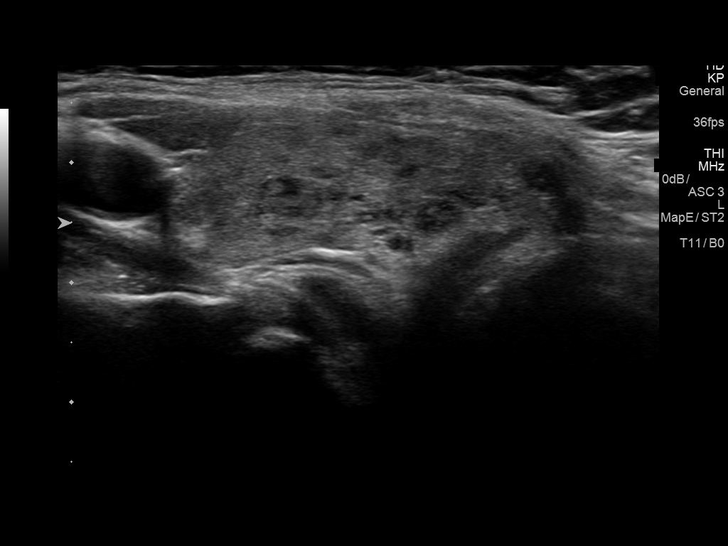
[im 40/53]
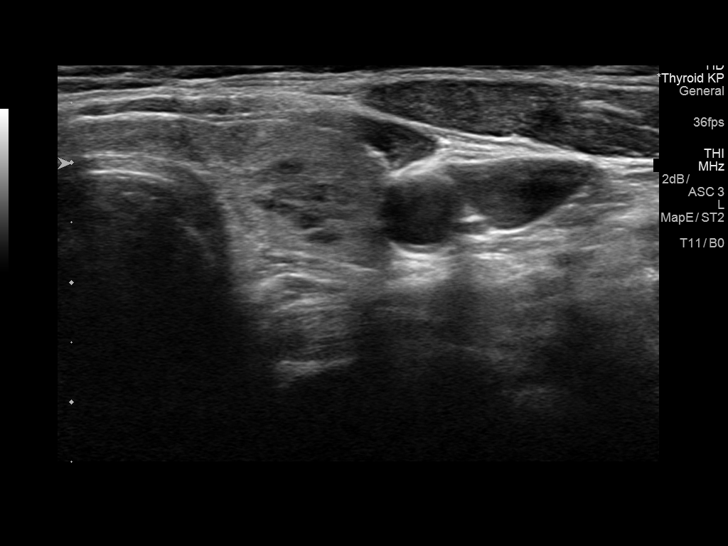
[im 44/53]
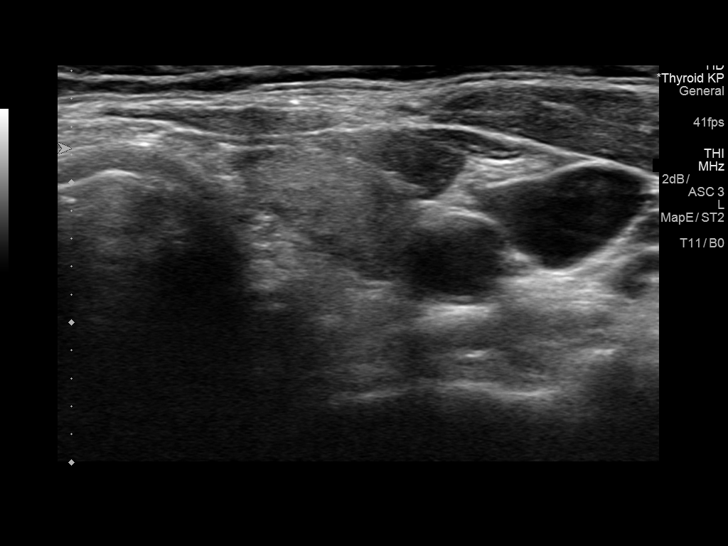
[im 48/53]
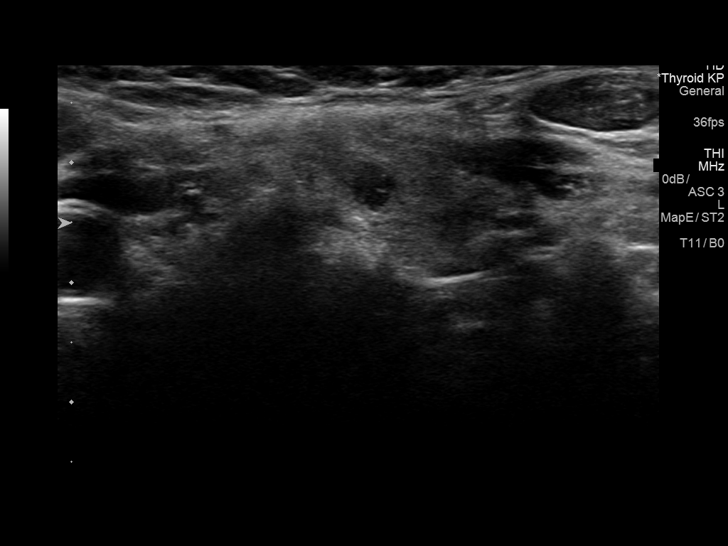
[im 53/53]
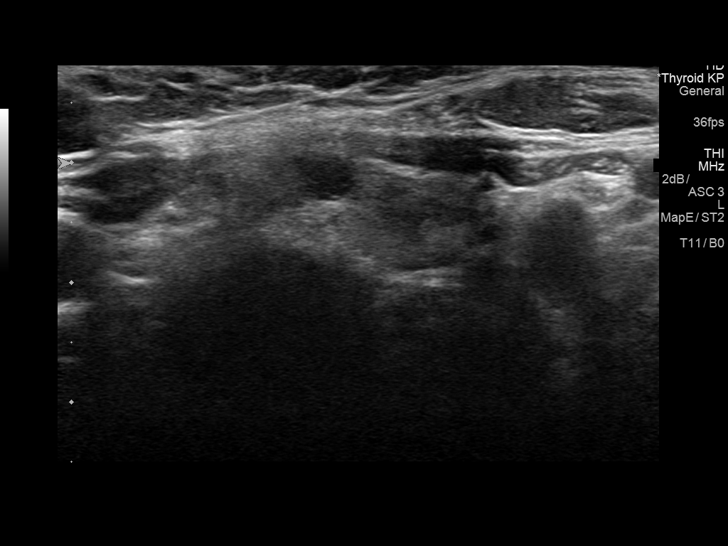

[13 of 25 positions shown; findings below may reference images not displayed]

FINDINGS: Parenchymal Echotexture: Moderately heterogenous

Isthmus: 4 mm

Right lobe: 4.5 x 1.3 x 1.6 cm

Left lobe: 4.1 x 1.2 x 1.3 cm

_________________________________________________________

Estimated total number of nodules >/= 1 cm: 0

Number of spongiform nodules >/=  2 cm not described below (TR1): 0

Number of mixed cystic and solid nodules >/= 1.5 cm not described
below (TR2): 0

_________________________________________________________

Moderate gland heterogeneity with mixed echogenicity. Normal
vascularity. 6 mm medial left inferior hypoechoic cystic nodule
noted. This appears to correlate with the CT finding. No other
significant measurable discrete significant nodule that would meet
criteria for biopsy or follow-up.

No adenopathy
IMPRESSION: Moderate gland heterogeneity with mixed echogenicity. No significant
measurable or discrete nodule that would meet TI rads criteria for
biopsy or follow-up.

The above is in keeping with the ACR TI-RADS recommendations - [HOSPITAL] 4850;[DATE].

## 2019-01-01 DIAGNOSIS — M1990 Unspecified osteoarthritis, unspecified site: Secondary | ICD-10-CM | POA: Diagnosis not present

## 2019-01-11 ENCOUNTER — Ambulatory Visit (HOSPITAL_COMMUNITY)
Admission: RE | Admit: 2019-01-11 | Discharge: 2019-01-11 | Disposition: A | Discharge status: Home / Self Care | Payer: BC Managed Care – PPO | Source: Ambulatory Visit | Attending: Internal Medicine | Admitting: Internal Medicine

## 2019-01-11 ENCOUNTER — Other Ambulatory Visit: Payer: Self-pay

## 2019-01-11 ENCOUNTER — Other Ambulatory Visit (HOSPITAL_COMMUNITY): Payer: Self-pay | Admitting: Internal Medicine

## 2019-01-11 ENCOUNTER — Other Ambulatory Visit: Payer: Self-pay | Admitting: Internal Medicine

## 2019-01-11 DIAGNOSIS — R1011 Right upper quadrant pain: Secondary | ICD-10-CM

## 2019-01-11 DIAGNOSIS — M4186 Other forms of scoliosis, lumbar region: Secondary | ICD-10-CM | POA: Diagnosis not present

## 2019-01-11 DIAGNOSIS — I878 Other specified disorders of veins: Secondary | ICD-10-CM | POA: Diagnosis not present

## 2019-03-08 DIAGNOSIS — E039 Hypothyroidism, unspecified: Secondary | ICD-10-CM | POA: Diagnosis not present

## 2019-03-08 DIAGNOSIS — E7800 Pure hypercholesterolemia, unspecified: Secondary | ICD-10-CM | POA: Diagnosis not present

## 2019-03-08 DIAGNOSIS — Z1159 Encounter for screening for other viral diseases: Secondary | ICD-10-CM | POA: Diagnosis not present

## 2019-03-13 DIAGNOSIS — F339 Major depressive disorder, recurrent, unspecified: Secondary | ICD-10-CM | POA: Diagnosis not present

## 2019-03-13 DIAGNOSIS — Z0001 Encounter for general adult medical examination with abnormal findings: Secondary | ICD-10-CM | POA: Diagnosis not present

## 2019-03-13 DIAGNOSIS — Z23 Encounter for immunization: Secondary | ICD-10-CM | POA: Diagnosis not present

## 2019-03-13 DIAGNOSIS — R600 Localized edema: Secondary | ICD-10-CM | POA: Diagnosis not present

## 2019-03-13 DIAGNOSIS — I10 Essential (primary) hypertension: Secondary | ICD-10-CM | POA: Diagnosis not present

## 2019-04-17 DIAGNOSIS — I10 Essential (primary) hypertension: Secondary | ICD-10-CM | POA: Diagnosis not present

## 2019-04-17 DIAGNOSIS — M1990 Unspecified osteoarthritis, unspecified site: Secondary | ICD-10-CM | POA: Diagnosis not present

## 2019-04-17 DIAGNOSIS — R221 Localized swelling, mass and lump, neck: Secondary | ICD-10-CM | POA: Diagnosis not present

## 2019-04-22 ENCOUNTER — Other Ambulatory Visit: Payer: Self-pay | Admitting: Internal Medicine

## 2019-04-22 DIAGNOSIS — R221 Localized swelling, mass and lump, neck: Secondary | ICD-10-CM

## 2019-04-25 ENCOUNTER — Other Ambulatory Visit: Payer: BC Managed Care – PPO

## 2019-05-01 ENCOUNTER — Other Ambulatory Visit: Payer: Self-pay

## 2019-05-01 ENCOUNTER — Ambulatory Visit
Admission: RE | Admit: 2019-05-01 | Discharge: 2019-05-01 | Disposition: A | Discharge status: Home / Self Care | Payer: BC Managed Care – PPO | Source: Ambulatory Visit | Attending: Internal Medicine | Admitting: Internal Medicine

## 2019-05-01 DIAGNOSIS — R221 Localized swelling, mass and lump, neck: Secondary | ICD-10-CM | POA: Diagnosis not present

## 2019-05-01 MED ORDER — IOPAMIDOL (ISOVUE-300) INJECTION 61%
75.0000 mL | Freq: Once | INTRAVENOUS | Status: AC | PRN
  Administered 2019-05-01: 09:00:00 75 mL via INTRAVENOUS

## 2019-06-07 ENCOUNTER — Other Ambulatory Visit: Payer: Self-pay | Admitting: Internal Medicine

## 2019-06-07 DIAGNOSIS — Z1231 Encounter for screening mammogram for malignant neoplasm of breast: Secondary | ICD-10-CM

## 2019-07-04 DIAGNOSIS — M2550 Pain in unspecified joint: Secondary | ICD-10-CM | POA: Diagnosis not present

## 2019-07-08 DIAGNOSIS — M5412 Radiculopathy, cervical region: Secondary | ICD-10-CM | POA: Diagnosis not present

## 2019-07-08 DIAGNOSIS — M542 Cervicalgia: Secondary | ICD-10-CM | POA: Diagnosis not present

## 2019-07-08 DIAGNOSIS — M1990 Unspecified osteoarthritis, unspecified site: Secondary | ICD-10-CM | POA: Diagnosis not present

## 2019-07-08 DIAGNOSIS — M79643 Pain in unspecified hand: Secondary | ICD-10-CM | POA: Diagnosis not present

## 2019-08-04 DIAGNOSIS — R300 Dysuria: Secondary | ICD-10-CM | POA: Diagnosis not present

## 2019-08-04 DIAGNOSIS — N3000 Acute cystitis without hematuria: Secondary | ICD-10-CM | POA: Diagnosis not present

## 2019-08-04 DIAGNOSIS — R519 Headache, unspecified: Secondary | ICD-10-CM | POA: Diagnosis not present

## 2019-08-04 DIAGNOSIS — J22 Unspecified acute lower respiratory infection: Secondary | ICD-10-CM | POA: Diagnosis not present

## 2019-08-05 DIAGNOSIS — K1120 Sialoadenitis, unspecified: Secondary | ICD-10-CM | POA: Diagnosis not present

## 2019-08-05 DIAGNOSIS — I889 Nonspecific lymphadenitis, unspecified: Secondary | ICD-10-CM | POA: Diagnosis not present

## 2019-08-05 DIAGNOSIS — J029 Acute pharyngitis, unspecified: Secondary | ICD-10-CM | POA: Diagnosis not present

## 2019-08-06 ENCOUNTER — Other Ambulatory Visit: Payer: Self-pay

## 2019-08-06 ENCOUNTER — Ambulatory Visit
Admission: RE | Admit: 2019-08-06 | Discharge: 2019-08-06 | Disposition: A | Discharge status: Home / Self Care | Payer: BC Managed Care – PPO | Source: Ambulatory Visit | Attending: Internal Medicine | Admitting: Internal Medicine

## 2019-08-06 DIAGNOSIS — Z1231 Encounter for screening mammogram for malignant neoplasm of breast: Secondary | ICD-10-CM | POA: Diagnosis not present

## 2019-08-19 DIAGNOSIS — K1120 Sialoadenitis, unspecified: Secondary | ICD-10-CM | POA: Diagnosis not present

## 2019-08-19 DIAGNOSIS — J029 Acute pharyngitis, unspecified: Secondary | ICD-10-CM | POA: Diagnosis not present

## 2019-08-19 DIAGNOSIS — I889 Nonspecific lymphadenitis, unspecified: Secondary | ICD-10-CM | POA: Diagnosis not present

## 2019-10-03 DIAGNOSIS — Z6826 Body mass index (BMI) 26.0-26.9, adult: Secondary | ICD-10-CM | POA: Diagnosis not present

## 2019-10-03 DIAGNOSIS — Z1389 Encounter for screening for other disorder: Secondary | ICD-10-CM | POA: Diagnosis not present

## 2019-10-03 DIAGNOSIS — N951 Menopausal and female climacteric states: Secondary | ICD-10-CM | POA: Diagnosis not present

## 2019-10-03 DIAGNOSIS — Z01419 Encounter for gynecological examination (general) (routine) without abnormal findings: Secondary | ICD-10-CM | POA: Diagnosis not present

## 2019-10-07 DIAGNOSIS — Z791 Long term (current) use of non-steroidal anti-inflammatories (NSAID): Secondary | ICD-10-CM | POA: Diagnosis not present

## 2019-10-10 DIAGNOSIS — M25552 Pain in left hip: Secondary | ICD-10-CM | POA: Diagnosis not present

## 2019-10-30 DIAGNOSIS — M25552 Pain in left hip: Secondary | ICD-10-CM | POA: Diagnosis not present

## 2019-10-30 DIAGNOSIS — N390 Urinary tract infection, site not specified: Secondary | ICD-10-CM | POA: Diagnosis not present

## 2019-10-30 DIAGNOSIS — R300 Dysuria: Secondary | ICD-10-CM | POA: Diagnosis not present

## 2019-10-30 DIAGNOSIS — M1612 Unilateral primary osteoarthritis, left hip: Secondary | ICD-10-CM | POA: Diagnosis not present

## 2019-11-20 DIAGNOSIS — M25552 Pain in left hip: Secondary | ICD-10-CM | POA: Diagnosis not present

## 2020-01-08 DIAGNOSIS — M1990 Unspecified osteoarthritis, unspecified site: Secondary | ICD-10-CM | POA: Diagnosis not present

## 2020-01-08 DIAGNOSIS — M5412 Radiculopathy, cervical region: Secondary | ICD-10-CM | POA: Diagnosis not present

## 2020-04-30 DIAGNOSIS — I10 Essential (primary) hypertension: Secondary | ICD-10-CM | POA: Diagnosis present

## 2020-04-30 DIAGNOSIS — M1612 Unilateral primary osteoarthritis, left hip: Secondary | ICD-10-CM | POA: Insufficient documentation

## 2020-06-22 DIAGNOSIS — R932 Abnormal findings on diagnostic imaging of liver and biliary tract: Secondary | ICD-10-CM | POA: Insufficient documentation

## 2020-08-04 DIAGNOSIS — Z96642 Presence of left artificial hip joint: Secondary | ICD-10-CM | POA: Insufficient documentation

## 2020-12-01 DIAGNOSIS — M7612 Psoas tendinitis, left hip: Secondary | ICD-10-CM | POA: Insufficient documentation

## 2020-12-07 DIAGNOSIS — I639 Cerebral infarction, unspecified: Secondary | ICD-10-CM | POA: Insufficient documentation

## 2021-06-22 DIAGNOSIS — K219 Gastro-esophageal reflux disease without esophagitis: Secondary | ICD-10-CM | POA: Diagnosis present

## 2021-06-22 DIAGNOSIS — F419 Anxiety disorder, unspecified: Secondary | ICD-10-CM | POA: Diagnosis present

## 2021-06-22 DIAGNOSIS — K5900 Constipation, unspecified: Secondary | ICD-10-CM | POA: Diagnosis present

## 2021-06-22 DIAGNOSIS — E559 Vitamin D deficiency, unspecified: Secondary | ICD-10-CM | POA: Diagnosis present

## 2021-11-03 DIAGNOSIS — M545 Low back pain, unspecified: Secondary | ICD-10-CM | POA: Diagnosis present

## 2023-03-05 ENCOUNTER — Emergency Department (HOSPITAL_COMMUNITY): Payer: BLUE CROSS/BLUE SHIELD

## 2023-03-05 ENCOUNTER — Inpatient Hospital Stay (HOSPITAL_COMMUNITY)
Admission: EM | Admit: 2023-03-05 | Discharge: 2023-03-07 | DRG: 392 | Disposition: A | Discharge status: Home / Self Care | Payer: BLUE CROSS/BLUE SHIELD | Attending: Internal Medicine | Admitting: Internal Medicine

## 2023-03-05 ENCOUNTER — Encounter (HOSPITAL_COMMUNITY): Payer: Self-pay

## 2023-03-05 ENCOUNTER — Other Ambulatory Visit: Payer: Self-pay

## 2023-03-05 DIAGNOSIS — Z6828 Body mass index (BMI) 28.0-28.9, adult: Secondary | ICD-10-CM | POA: Diagnosis not present

## 2023-03-05 DIAGNOSIS — G43909 Migraine, unspecified, not intractable, without status migrainosus: Secondary | ICD-10-CM | POA: Diagnosis present

## 2023-03-05 DIAGNOSIS — Z87891 Personal history of nicotine dependence: Secondary | ICD-10-CM

## 2023-03-05 DIAGNOSIS — Z888 Allergy status to other drugs, medicaments and biological substances status: Secondary | ICD-10-CM | POA: Diagnosis not present

## 2023-03-05 DIAGNOSIS — M545 Low back pain, unspecified: Secondary | ICD-10-CM | POA: Diagnosis present

## 2023-03-05 DIAGNOSIS — Z885 Allergy status to narcotic agent status: Secondary | ICD-10-CM

## 2023-03-05 DIAGNOSIS — F419 Anxiety disorder, unspecified: Secondary | ICD-10-CM | POA: Diagnosis present

## 2023-03-05 DIAGNOSIS — Z7982 Long term (current) use of aspirin: Secondary | ICD-10-CM

## 2023-03-05 DIAGNOSIS — K219 Gastro-esophageal reflux disease without esophagitis: Secondary | ICD-10-CM | POA: Diagnosis present

## 2023-03-05 DIAGNOSIS — R1030 Lower abdominal pain, unspecified: Principal | ICD-10-CM

## 2023-03-05 DIAGNOSIS — I10 Essential (primary) hypertension: Secondary | ICD-10-CM | POA: Diagnosis present

## 2023-03-05 DIAGNOSIS — E663 Overweight: Secondary | ICD-10-CM | POA: Diagnosis present

## 2023-03-05 DIAGNOSIS — Z79899 Other long term (current) drug therapy: Secondary | ICD-10-CM | POA: Diagnosis not present

## 2023-03-05 DIAGNOSIS — Z91013 Allergy to seafood: Secondary | ICD-10-CM

## 2023-03-05 DIAGNOSIS — Z8711 Personal history of peptic ulcer disease: Secondary | ICD-10-CM | POA: Diagnosis not present

## 2023-03-05 DIAGNOSIS — K5900 Constipation, unspecified: Secondary | ICD-10-CM | POA: Diagnosis present

## 2023-03-05 DIAGNOSIS — E7800 Pure hypercholesterolemia, unspecified: Secondary | ICD-10-CM | POA: Diagnosis present

## 2023-03-05 DIAGNOSIS — I700 Atherosclerosis of aorta: Secondary | ICD-10-CM | POA: Diagnosis present

## 2023-03-05 DIAGNOSIS — E559 Vitamin D deficiency, unspecified: Secondary | ICD-10-CM | POA: Diagnosis present

## 2023-03-05 DIAGNOSIS — K529 Noninfective gastroenteritis and colitis, unspecified: Principal | ICD-10-CM | POA: Diagnosis present

## 2023-03-05 DIAGNOSIS — F32A Depression, unspecified: Secondary | ICD-10-CM | POA: Diagnosis present

## 2023-03-05 LAB — URINALYSIS, ROUTINE W REFLEX MICROSCOPIC
Glucose, UA: NEGATIVE mg/dL
Hgb urine dipstick: NEGATIVE
Ketones, ur: 5 mg/dL — AB
Leukocytes,Ua: NEGATIVE
Nitrite: NEGATIVE
Protein, ur: NEGATIVE mg/dL
Specific Gravity, Urine: 1.026 (ref 1.005–1.030)
pH: 5 (ref 5.0–8.0)

## 2023-03-05 LAB — COMPREHENSIVE METABOLIC PANEL
ALT: 30 U/L (ref 0–44)
AST: 22 U/L (ref 15–41)
Albumin: 4.9 g/dL (ref 3.5–5.0)
Alkaline Phosphatase: 51 U/L (ref 38–126)
Anion gap: 11 (ref 5–15)
BUN: 30 mg/dL — ABNORMAL HIGH (ref 8–23)
CO2: 21 mmol/L — ABNORMAL LOW (ref 22–32)
Calcium: 9.3 mg/dL (ref 8.9–10.3)
Chloride: 104 mmol/L (ref 98–111)
Creatinine, Ser: 0.95 mg/dL (ref 0.44–1.00)
GFR, Estimated: >60 mL/min (ref >60)
Glucose, Bld: 123 mg/dL — ABNORMAL HIGH (ref 70–99)
Potassium: 3.7 mmol/L (ref 3.5–5.1)
Sodium: 136 mmol/L (ref 135–145)
Total Bilirubin: 0.7 mg/dL (ref 0.3–1.2)
Total Protein: 7.5 g/dL (ref 6.5–8.1)

## 2023-03-05 LAB — CBC WITH DIFFERENTIAL/PLATELET
Abs Immature Granulocytes: 0.07 10*3/uL (ref 0.00–0.07)
Basophils Absolute: 0 10*3/uL (ref 0.0–0.1)
Basophils Relative: 0 %
Eosinophils Absolute: 0 10*3/uL (ref 0.0–0.5)
Eosinophils Relative: 0 %
HCT: 40.2 % (ref 36.0–46.0)
Hemoglobin: 13.3 g/dL (ref 12.0–15.0)
Immature Granulocytes: 1 %
Lymphocytes Relative: 12 %
Lymphs Abs: 1.6 10*3/uL (ref 0.7–4.0)
MCH: 31.2 pg (ref 26.0–34.0)
MCHC: 33.1 g/dL (ref 30.0–36.0)
MCV: 94.4 fL (ref 80.0–100.0)
Monocytes Absolute: 1 10*3/uL (ref 0.1–1.0)
Monocytes Relative: 7 %
Neutro Abs: 11.3 10*3/uL — ABNORMAL HIGH (ref 1.7–7.7)
Neutrophils Relative %: 80 %
Platelets: 371 10*3/uL (ref 150–400)
RBC: 4.26 MIL/uL (ref 3.87–5.11)
RDW: 13.8 % (ref 11.5–15.5)
WBC: 14.1 10*3/uL — ABNORMAL HIGH (ref 4.0–10.5)
nRBC: 0 % (ref 0.0–0.2)

## 2023-03-05 LAB — LIPASE, BLOOD: Lipase: 39 U/L (ref 11–51)

## 2023-03-05 LAB — MAGNESIUM: Magnesium: 2.7 mg/dL — ABNORMAL HIGH (ref 1.7–2.4)

## 2023-03-05 MED ORDER — ONDANSETRON HCL 4 MG/2ML IJ SOLN
4.0000 mg | Freq: Once | INTRAMUSCULAR | Status: AC
  Administered 2023-03-05: 4 mg via INTRAVENOUS
  Filled 2023-03-05: qty 2

## 2023-03-05 MED ORDER — FAMOTIDINE 20 MG PO TABS: 40.0000 mg | ORAL_TABLET | Freq: Every day | ORAL | Status: DC | PRN

## 2023-03-05 MED ORDER — MORPHINE SULFATE (PF) 4 MG/ML IV SOLN
4.0000 mg | Freq: Once | INTRAVENOUS | Status: AC
  Administered 2023-03-05: 4 mg via INTRAVENOUS
  Filled 2023-03-05: qty 1

## 2023-03-05 MED ORDER — SENNOSIDES-DOCUSATE SODIUM 8.6-50 MG PO TABS
1.0000 | ORAL_TABLET | Freq: Two times a day (BID) | ORAL | Status: DC
  Administered 2023-03-05 – 2023-03-07 (×4): 1 via ORAL
  Filled 2023-03-05 (×5): qty 1

## 2023-03-05 MED ORDER — ZOLPIDEM TARTRATE 5 MG PO TABS: 2.5000 mg | ORAL_TABLET | Freq: Every evening | ORAL | Status: DC | PRN

## 2023-03-05 MED ORDER — BUPROPION HCL ER (XL) 150 MG PO TB24
300.0000 mg | ORAL_TABLET | Freq: Every day | ORAL | Status: DC
  Administered 2023-03-05 – 2023-03-06 (×2): 300 mg via ORAL
  Filled 2023-03-05 (×2): qty 2

## 2023-03-05 MED ORDER — PANTOPRAZOLE SODIUM 40 MG IV SOLR
40.0000 mg | Freq: Once | INTRAVENOUS | Status: AC
  Administered 2023-03-05: 40 mg via INTRAVENOUS
  Filled 2023-03-05: qty 10

## 2023-03-05 MED ORDER — VITAMIN B-12 1000 MCG PO TABS
1000.0000 ug | ORAL_TABLET | Freq: Every day | ORAL | Status: DC
  Administered 2023-03-05 – 2023-03-07 (×3): 1000 ug via ORAL
  Filled 2023-03-05 (×3): qty 1

## 2023-03-05 MED ORDER — KETOROLAC TROMETHAMINE 15 MG/ML IJ SOLN
15.0000 mg | Freq: Two times a day (BID) | INTRAMUSCULAR | Status: DC
  Administered 2023-03-05 – 2023-03-06 (×3): 15 mg via INTRAVENOUS
  Filled 2023-03-05 (×3): qty 1

## 2023-03-05 MED ORDER — ROSUVASTATIN CALCIUM 20 MG PO TABS
20.0000 mg | ORAL_TABLET | Freq: Every day | ORAL | Status: DC
  Administered 2023-03-05 – 2023-03-06 (×2): 20 mg via ORAL
  Filled 2023-03-05 (×2): qty 1

## 2023-03-05 MED ORDER — MORPHINE SULFATE (PF) 4 MG/ML IV SOLN
4.0000 mg | INTRAVENOUS | Status: DC | PRN
  Administered 2023-03-05 – 2023-03-06 (×6): 4 mg via INTRAVENOUS
  Filled 2023-03-05 (×6): qty 1

## 2023-03-05 MED ORDER — ASPIRIN 81 MG PO TBEC
81.0000 mg | DELAYED_RELEASE_TABLET | Freq: Every day | ORAL | Status: DC
  Administered 2023-03-05 – 2023-03-06 (×2): 81 mg via ORAL
  Filled 2023-03-05 (×2): qty 1

## 2023-03-05 MED ORDER — SODIUM CHLORIDE 0.9 % IV SOLN
2.0000 g | Freq: Once | INTRAVENOUS | Status: AC
  Administered 2023-03-05: 2 g via INTRAVENOUS
  Filled 2023-03-05: qty 20

## 2023-03-05 MED ORDER — IOHEXOL 300 MG/ML  SOLN
100.0000 mL | Freq: Once | INTRAMUSCULAR | Status: AC | PRN
  Administered 2023-03-05: 100 mL via INTRAVENOUS

## 2023-03-05 MED ORDER — EZETIMIBE 10 MG PO TABS
10.0000 mg | ORAL_TABLET | Freq: Every day | ORAL | Status: DC
  Administered 2023-03-05 – 2023-03-06 (×2): 10 mg via ORAL
  Filled 2023-03-05 (×2): qty 1

## 2023-03-05 MED ORDER — ONDANSETRON HCL 4 MG PO TABS: 4.0000 mg | ORAL_TABLET | Freq: Four times a day (QID) | ORAL | Status: DC | PRN

## 2023-03-05 MED ORDER — SODIUM CHLORIDE 0.9 % IV SOLN
2.0000 g | INTRAVENOUS | Status: DC
  Administered 2023-03-06 – 2023-03-07 (×2): 2 g via INTRAVENOUS
  Filled 2023-03-05 (×3): qty 20

## 2023-03-05 MED ORDER — ONDANSETRON HCL 4 MG/2ML IJ SOLN: 4.0000 mg | Freq: Four times a day (QID) | INTRAMUSCULAR | Status: DC | PRN

## 2023-03-05 MED ORDER — ACETAMINOPHEN 325 MG PO TABS: 650.0000 mg | ORAL_TABLET | Freq: Four times a day (QID) | ORAL | Status: DC | PRN

## 2023-03-05 MED ORDER — ENOXAPARIN SODIUM 40 MG/0.4ML IJ SOSY
40.0000 mg | PREFILLED_SYRINGE | INTRAMUSCULAR | Status: DC
  Administered 2023-03-05 – 2023-03-06 (×2): 40 mg via SUBCUTANEOUS
  Filled 2023-03-05 (×2): qty 0.4

## 2023-03-05 MED ORDER — SODIUM CHLORIDE 0.9 % IV SOLN: INTRAVENOUS | Status: AC

## 2023-03-05 MED ORDER — SODIUM CHLORIDE 0.9 % IV BOLUS
1000.0000 mL | Freq: Once | INTRAVENOUS | Status: AC
  Administered 2023-03-05: 1000 mL via INTRAVENOUS

## 2023-03-05 MED ORDER — PANTOPRAZOLE SODIUM 40 MG PO TBEC
40.0000 mg | DELAYED_RELEASE_TABLET | Freq: Every day | ORAL | Status: DC
  Administered 2023-03-05 – 2023-03-06 (×2): 40 mg via ORAL
  Filled 2023-03-05 (×2): qty 1

## 2023-03-05 MED ORDER — ACETAMINOPHEN 650 MG RE SUPP: 650.0000 mg | Freq: Four times a day (QID) | RECTAL | Status: DC | PRN

## 2023-03-05 MED ORDER — METHOCARBAMOL 500 MG PO TABS
500.0000 mg | ORAL_TABLET | Freq: Four times a day (QID) | ORAL | Status: DC | PRN
  Administered 2023-03-05 – 2023-03-06 (×2): 500 mg via ORAL
  Filled 2023-03-05 (×2): qty 1

## 2023-03-05 MED ORDER — METRONIDAZOLE 500 MG/100ML IV SOLN
500.0000 mg | Freq: Once | INTRAVENOUS | Status: AC
  Administered 2023-03-05: 500 mg via INTRAVENOUS
  Filled 2023-03-05: qty 100

## 2023-03-05 MED ORDER — LOSARTAN POTASSIUM 25 MG PO TABS
50.0000 mg | ORAL_TABLET | Freq: Every day | ORAL | Status: DC
  Administered 2023-03-05 – 2023-03-06 (×2): 50 mg via ORAL
  Filled 2023-03-05 (×2): qty 2

## 2023-03-05 MED ORDER — METRONIDAZOLE 500 MG/100ML IV SOLN
500.0000 mg | Freq: Two times a day (BID) | INTRAVENOUS | Status: DC
  Administered 2023-03-05 – 2023-03-07 (×4): 500 mg via INTRAVENOUS
  Filled 2023-03-05 (×4): qty 100

## 2023-03-05 NOTE — ED Provider Notes (Signed)
[place] [facility] AT [facility] Provider Note   CSN: [id number] Arrival date & time: 03/05/23  2536     History  Chief Complaint  Patient presents with   Abdominal Pain    Audrey Serrano is a 62 y.o. female.  62 year old female with prior medical history as detailed below presents for evaluation.  Patient complains of suprapubic and left lower quadrant abdominal pain that began around 9 PM last night.  This was associated with nausea vomiting.  She denies fever.  She denies prior history of symptoms similar to today.  The history is provided by the patient and medical records.       Home Medications Prior to Admission medications   Medication Sig Start Date End Date Taking? Authorizing Provider  aspirin 81 MG tablet Take 81 mg by mouth at bedtime.     [provider]  Azelastine-Fluticasone (DYMISTA) 137-50 MCG/ACT SUSP Dymista 137 mcg-50 mcg/spray nasal spray    [provider]  buPROPion (WELLBUTRIN XL) 150 MG 24 hr tablet Take 300 mg by mouth at bedtime.     [provider]  CRESTOR 40 MG tablet Take 20 mg by mouth at bedtime. 01/06/16   [provider]  ezetimibe (ZETIA) 10 MG tablet Take 5 mg by mouth at bedtime.    [provider]  FLUoxetine (PROZAC) 10 MG capsule Take 20 mg by mouth at bedtime.     [provider]  HYDROcodone-acetaminophen (NORCO/VICODIN) 5-325 MG tablet Take 1-2 tablets by mouth every 6 (six) hours as needed for moderate pain or severe pain. 09/23/18   Shaune Pollack, MD  hyoscyamine (LEVSIN, ANASPAZ) 0.125 MG tablet hyoscyamine sulfate 0.125 mg tablet    [provider]  ibuprofen (ADVIL,MOTRIN) 200 MG tablet Take 400 mg by mouth daily as needed for moderate pain or cramping.    [provider]  LORazepam (ATIVAN) 1 MG tablet Take 1 mg by mouth daily as needed for anxiety.    [provider]  losartan (COZAAR) 50 MG tablet  02/28/17   [provider]  medroxyPROGESTERone (PROVERA) 5 MG tablet medroxyprogesterone 5 mg tablet  TAKE ONE TABLET BY MOUTH DAILY    [provider]  meloxicam (MOBIC) 15 MG tablet  12/14/16   [provider]  metaxalone (SKELAXIN) 800 MG tablet Take 800 mg by mouth at bedtime. 01/15/16   [provider]  oxybutynin (DITROPAN-XL) 10 MG 24 hr tablet oxybutynin chloride ER 10 mg tablet,extended release 24 hr    [provider]  pantoprazole (PROTONIX) 40 MG tablet Take 40 mg by mouth at bedtime.     [provider]  zolpidem (AMBIEN) 5 MG tablet Take 2.5 mg by mouth at bedtime as needed for sleep.    [provider]      Allergies    Antihistamines, chlorpheniramine-type; Macrobid [nitrofurantoin]; Other; Prednisone; and Shellfish allergy    Review of Systems   Review of Systems  All other systems reviewed and are negative.   Physical Exam Updated Vital Signs BP (!) 152/81 (BP Location: Left Arm)   Pulse 84   Temp 98.5 F (36.9 C) (Oral)   Resp 18   Ht 5\' 5"  (1.651 m)   Wt 77.1 kg   SpO2 99%   BMI 28.29 kg/m  Physical Exam Vitals and nursing note reviewed.  Constitutional:      General: She is not in acute distress.    Appearance: Normal appearance. She is well-developed.  HENT:  Head: Normocephalic and atraumatic.  Eyes:     Conjunctiva/sclera: Conjunctivae normal.     Pupils: Pupils are equal, round, and reactive to light.  Cardiovascular:     Rate and Rhythm: Normal rate and regular rhythm.     Heart sounds: Normal heart sounds.  Pulmonary:     Effort: Pulmonary effort is normal. No respiratory distress.     Breath sounds: Normal breath sounds.  Abdominal:     General: There is no distension.     Palpations: Abdomen is soft.     Tenderness: There is abdominal tenderness in the suprapubic area and left lower quadrant.  Musculoskeletal:        General: No deformity. Normal range of motion.     Cervical back: Normal  range of motion and neck supple.  Skin:    General: Skin is warm and dry.  Neurological:     General: No focal deficit present.     Mental Status: She is alert and oriented to person, place, and time.     ED Results / Procedures / Treatments   Labs (all labs ordered are listed, but only abnormal results are displayed) Labs Reviewed  COMPREHENSIVE METABOLIC PANEL - Abnormal; Notable for the following components:      Result Value   CO2 21 (*)    Glucose, Bld 123 (*)    BUN 30 (*)    All other components within normal limits  URINALYSIS, ROUTINE W REFLEX MICROSCOPIC - Abnormal; Notable for the following components:   Color, Urine AMBER (*)    Bilirubin Urine SMALL (*)    Ketones, ur 5 (*)    All other components within normal limits  CBC WITH DIFFERENTIAL/PLATELET - Abnormal; Notable for the following components:   WBC 14.1 (*)    Neutro Abs 11.3 (*)    All other components within normal limits  LIPASE, BLOOD    EKG None  Radiology No results found.  Procedures Procedures    Medications Ordered in ED Medications  sodium chloride 0.9 % bolus 1,000 mL (1,000 mLs Intravenous New Bag/Given 03/05/23 0735)  ondansetron (ZOFRAN) injection 4 mg (4 mg Intravenous Given 03/05/23 0735)  morphine (PF) 4 MG/ML injection 4 mg (4 mg Intravenous Given 03/05/23 0735)  iohexol (OMNIPAQUE) 300 MG/ML solution 100 mL (100 mLs Intravenous Contrast Given 03/05/23 0100)    ED Course/ Medical Decision Making/ A&P                                 Medical Decision Making Amount and/or Complexity of Data Reviewed Labs: ordered. Radiology: ordered.  Risk Prescription drug management. Decision regarding hospitalization.    Medical Screen Complete  This patient presented to the ED with complaint of abdominal pain.  This complaint involves an extensive number of treatment options. The initial differential diagnosis includes, but is not limited to, diverticulitis, colitis, abscess, metabolic  abnormality, other intra-abdominal pathology  This presentation is: Acute, Self-Limited, Previously Undiagnosed, Uncertain Prognosis, Complicated, Systemic Symptoms, and Threat to Life/Bodily Function  Patient is presenting with acute onset lower abdominal pain with associated nausea and vomiting.  Workup is consistent with colitis versus diverticulitis.  Given patient's persistent pain and nausea admission is warranted.  IV antibiotics administered here in the ED.  Hospitalist service made aware of case and will evaluate for admission.  Additional history obtained:  External records from outside sources obtained and reviewed including prior ED visits and prior  Inpatient records.    Lab Tests:  I ordered and personally interpreted labs.  The pertinent results include: BC, CMP, lipase   Imaging Studies ordered:  I ordered imaging studies including CT abdomen pelvis I independently visualized and interpreted obtained imaging which showed colitis I agree with the radiologist interpretation.   Cardiac Monitoring:  The patient was maintained on a cardiac monitor.  I personally viewed and interpreted the cardiac monitor which showed an underlying rhythm of: NSR   Medicines ordered:  I ordered medication including antibiotics, morphine, Zofran for infection, pain, nausea Reevaluation of the patient after these medicines showed that the patient: improved   Problem List / ED Course:  Colitis, abdominal pain, nausea, vomiting   Reevaluation:  After the interventions noted above, I reevaluated the patient and found that they have: improved   Disposition:  After consideration of the diagnostic results and the patients response to treatment, I feel that the patent would benefit from admission         Final Clinical Impression(s) / ED Diagnoses Final diagnoses:  Lower abdominal pain  Colitis    Rx / DC Orders ED Discharge Orders     None         Wynetta Fines, MD 03/05/23 1556

## 2023-03-05 NOTE — ED Triage Notes (Addendum)
Severe suprapubic pains manifesting ~9PM last night.   She says she is having nausea, vomiting and subjective abdominal distention.

## 2023-03-05 NOTE — H&P (Signed)
History and Physical    Patient: Audrey Serrano JXB:[id number] DOB: 07-21-1960 DOA: 03/05/2023 DOS: the patient was seen and examined on 03/05/2023 PCP: Harvel Quale, DO  Patient coming from: Home  Chief Complaint:  Chief Complaint  Patient presents with   Abdominal Pain   HPI: Audrey Serrano is a 62 y.o. female with medical history significant of chronic anxiety, Herpes simplex, GERD, hiatal hernia, migraine headaches, hyperlipidemia, PUD, bilateral thyroid nodules, overweight right, stress incontinence who presented to the emergency department complaints of abdominal pain since yesterday around 2100 associated with multiple episodes of nausea/emesis and constipation for 6 days.  No diarrhea, melena or hematochezia.  No flank pain, dysuria, frequency or hematuria.  He denied fever, chills, rhinorrhea, sore throat, wheezing or hemoptysis.  No chest pain, palpitations, diaphoresis, PND, orthopnea or pitting edema of the lower extremities. No polyuria, polydipsia, polyphagia or blurred vision.   Lab work: Her urine analysis shows small bilirubin and ketones of 5 mg/dL.  CBC showed a white count of 14.1 with 80% neutrophils, hemoglobin 13.3 g/dL platelets 130.  Lipase level was normal.  CMP showed a CO2 of 21 mmol/L, glucose of 123 and BUN of 30 mg/dL.  The rest of the CMP measurements were normal.  Imaging: CT abdomen/pelvis with contrast showed mild diverticulosis of the descending and sigmoid colon.  There was also wall thickening and inflammatory changes involving the ascending and sigmoid colon most consistent with infectious or inflammatory colitis given the more diffuse nature of this inflammation.  Diverticulitis cannot be excluded, although no definite inflamed diverticuli is noted.  Large amount of stool seen throughout the colon.  Aortic atherosclerosis.   ED course: Initial vital signs were temperature 98.5 F, pulse 84, respiration 18, BP 152/81 mmHg O2 sat 99% on room air.  The  patient received 1000 mL of normal saline bolus, ondansetron 4 mg IVP, morphine 4 mg IVP x 2, ceftriaxone 2 g IVPB and metronidazole 500 mg IVPB.  Review of Systems: As mentioned in the history of present illness. All other systems reviewed and are negative. Past Medical History:  Diagnosis Date   Anxiety    chronic   GERD (gastroesophageal reflux disease)    H/O hiatal hernia    Headache(784.0)    migraine   High cholesterol    Stomach ulcer    Thyroid nodule 2012   Bilateral   Past Surgical History:  Procedure Laterality Date   biopsy of thyroid nodule  2012   bladder tack   2012   CESAREAN SECTION  1994   HOT HEMOSTASIS  11/23/2011   Procedure: HOT HEMOSTASIS (ARGON PLASMA COAGULATION/BICAP);  Surgeon: Willis Modena, MD;  Location: [facility];  Service: Endoscopy;  Laterality: N/A;   Social History:  reports that she quit smoking about 26 years ago. She has never used smokeless tobacco. She reports current alcohol use. She reports that she does not use drugs.  Allergies  Allergen Reactions   Antihistamines, Chlorpheniramine-Type     Pt stated, "Makes me nervous, excitable"   Macrobid [Nitrofurantoin] Nausea And Vomiting   Other Other (See Comments)    Cause hyper activeness Oral Steroids causes altered mental state.  Injections are fine   Prednisone Other (See Comments)    agitation, nausea Other reaction(s): Confusion   Shellfish Allergy Other (See Comments)    Family History  Problem Relation Age of Onset   Malignant hyperthermia Neg Hx     Prior to Admission medications   Medication Sig Start Date End Date  Taking? Authorizing Provider  aspirin 81 MG tablet Take 81 mg by mouth at bedtime.     [provider]  Azelastine-Fluticasone (DYMISTA) 137-50 MCG/ACT SUSP Dymista 137 mcg-50 mcg/spray nasal spray    [provider]  buPROPion (WELLBUTRIN XL) 150 MG 24 hr tablet Take 300 mg by mouth at bedtime.     [provider]  CRESTOR 40 MG  tablet Take 20 mg by mouth at bedtime. 01/06/16   [provider]  ezetimibe (ZETIA) 10 MG tablet Take 5 mg by mouth at bedtime.    [provider]  FLUoxetine (PROZAC) 10 MG capsule Take 20 mg by mouth at bedtime.     [provider]  HYDROcodone-acetaminophen (NORCO/VICODIN) 5-325 MG tablet Take 1-2 tablets by mouth every 6 (six) hours as needed for moderate pain or severe pain. 09/23/18   Shaune Pollack, MD  hyoscyamine (LEVSIN, ANASPAZ) 0.125 MG tablet hyoscyamine sulfate 0.125 mg tablet    [provider]  ibuprofen (ADVIL,MOTRIN) 200 MG tablet Take 400 mg by mouth daily as needed for moderate pain or cramping.    [provider]  LORazepam (ATIVAN) 1 MG tablet Take 1 mg by mouth daily as needed for anxiety.    [provider]  losartan (COZAAR) 50 MG tablet  02/28/17   [provider]  medroxyPROGESTERone (PROVERA) 5 MG tablet medroxyprogesterone 5 mg tablet  TAKE ONE TABLET BY MOUTH DAILY    [provider]  meloxicam (MOBIC) 15 MG tablet  12/14/16   [provider]  metaxalone (SKELAXIN) 800 MG tablet Take 800 mg by mouth at bedtime. 01/15/16   [provider]  oxybutynin (DITROPAN-XL) 10 MG 24 hr tablet oxybutynin chloride ER 10 mg tablet,extended release 24 hr    [provider]  pantoprazole (PROTONIX) 40 MG tablet Take 40 mg by mouth at bedtime.     [provider]  zolpidem (AMBIEN) 5 MG tablet Take 2.5 mg by mouth at bedtime as needed for sleep.    [provider]    Physical Exam: Vitals:   03/05/23 1610 03/05/23 0635  BP: (!) 152/81   Pulse: 84   Resp: 18   Temp: 98.5 F (36.9 C)   TempSrc: Oral   SpO2: 99%   Weight:  77.1 kg  Height:  5\' 5"  (1.651 m)   Physical Exam Vitals reviewed.  Constitutional:      Appearance: She is well-developed.  HENT:     Head: Normocephalic.  Eyes:     General: No scleral icterus.    Pupils: Pupils are equal, round, and  reactive to light.  Cardiovascular:     Rate and Rhythm: Normal rate.  Pulmonary:     Effort: Pulmonary effort is normal.     Breath sounds: Normal breath sounds.  Abdominal:     General: Bowel sounds are normal.     Palpations: Abdomen is soft.     Tenderness: There is abdominal tenderness in the left lower quadrant. There is left CVA tenderness. There is no right CVA tenderness, guarding or rebound.  Skin:    General: Skin is warm and dry.  Neurological:     General: No focal deficit present.     Mental Status: She is alert and oriented to person, place, and time.  Psychiatric:        Mood and Affect: Mood normal.        Behavior: Behavior normal.     Data Reviewed:  Results are pending, will  review when available.  Assessment and Plan: Principal Problem:   Acute colitis Admit to MedSurg/inpatient. Continue IV fluids. Analgesics as needed. Antiemetics as needed. Continue ceftriaxone 2 g IVPB every 24 hours.   Continue metronidazole 500 mg IVPB q 12 hr. Follow-up blood culture and sensitivity Follow CBC and CMP in a.m.  Active Problems:   Constipation Begin Senokot 1 tablet p.o. twice daily. May resume MiraLAX once pain is under control.    Hypercholesterolemia/aortic atherosclerosis (HCC) Continue rosuvastatin 40 mg p.o. daily at bedtime. Continue ezetimibe 10 mg p.o. bedtime.    Migraine No significant symptoms at the moment. Analgesics as needed.    Essential hypertension Continue losartan 50 mg p.o. bedtime.    GERD without esophagitis Pantoprazole 40 mg IVP x 1 dose now. Continue pantoprazole 40 mg p.o. daily tomorrow a.m. Oral famotidine as needed.    Anxiety and depression Continue bupropion XL 300 mg p.o. at bedtime.    Lumbar spine pain Continue current analgesics as needed. Will hold oral diclofenac for now. Ketorolac 50 mg p.o. twice daily x 4 doses.    Vitamin D deficiency Advised to use fiber at different times than  meds/vitamins.       Advance Care Planning:   Code Status: Full Code   Consults:   Family Communication: Her husband was at bedside.  Severity of Illness: The appropriate patient status for this patient is INPATIENT. Inpatient status is judged to be reasonable and necessary in order to provide the required intensity of service to ensure the patient's safety. The patient's presenting symptoms, physical exam findings, and initial radiographic and laboratory data in the context of their chronic comorbidities is felt to place them at high risk for further clinical deterioration. Furthermore, it is not anticipated that the patient will be medically stable for discharge from the hospital within 2 midnights of admission.   * I certify that at the point of admission it is my clinical judgment that the patient will require inpatient hospital care spanning beyond 2 midnights from the point of admission due to high intensity of service, high risk for further deterioration and high frequency of surveillance required.*  Author: Bobette Mo, MD 03/05/2023 9:43 AM  For on call review [email or web address].   This document was prepared using Dragon voice recognition software and may contain some unintended transcription errors.

## 2023-03-06 DIAGNOSIS — K529 Noninfective gastroenteritis and colitis, unspecified: Secondary | ICD-10-CM | POA: Diagnosis not present

## 2023-03-06 LAB — COMPREHENSIVE METABOLIC PANEL
ALT: 17 U/L (ref 0–44)
AST: 12 U/L — ABNORMAL LOW (ref 15–41)
Albumin: 3.3 g/dL — ABNORMAL LOW (ref 3.5–5.0)
Alkaline Phosphatase: 39 U/L (ref 38–126)
Anion gap: 6 (ref 5–15)
BUN: 16 mg/dL (ref 8–23)
CO2: 21 mmol/L — ABNORMAL LOW (ref 22–32)
Calcium: 8 mg/dL — ABNORMAL LOW (ref 8.9–10.3)
Chloride: 109 mmol/L (ref 98–111)
Creatinine, Ser: 0.73 mg/dL (ref 0.44–1.00)
GFR, Estimated: >60 mL/min (ref >60)
Glucose, Bld: 95 mg/dL (ref 70–99)
Potassium: 3.9 mmol/L (ref 3.5–5.1)
Sodium: 136 mmol/L (ref 135–145)
Total Bilirubin: 0.6 mg/dL (ref 0.3–1.2)
Total Protein: 5.4 g/dL — ABNORMAL LOW (ref 6.5–8.1)

## 2023-03-06 LAB — HIV ANTIBODY (ROUTINE TESTING W REFLEX): HIV Screen 4th Generation wRfx: NONREACTIVE

## 2023-03-06 LAB — CBC
HCT: 31.3 % — ABNORMAL LOW (ref 36.0–46.0)
Hemoglobin: 10 g/dL — ABNORMAL LOW (ref 12.0–15.0)
MCH: 31.1 pg (ref 26.0–34.0)
MCHC: 31.9 g/dL (ref 30.0–36.0)
MCV: 97.2 fL (ref 80.0–100.0)
Platelets: 249 10*3/uL (ref 150–400)
RBC: 3.22 MIL/uL — ABNORMAL LOW (ref 3.87–5.11)
RDW: 14.1 % (ref 11.5–15.5)
WBC: 8.9 10*3/uL (ref 4.0–10.5)
nRBC: 0 % (ref 0.0–0.2)

## 2023-03-06 MED ORDER — SODIUM CHLORIDE 0.9 % IV SOLN: INTRAVENOUS | Status: AC

## 2023-03-06 MED ORDER — TRAMADOL HCL 50 MG PO TABS
50.0000 mg | ORAL_TABLET | Freq: Four times a day (QID) | ORAL | Status: DC | PRN
  Administered 2023-03-06: 50 mg via ORAL
  Filled 2023-03-06: qty 1

## 2023-03-06 MED ORDER — HYDROCODONE-ACETAMINOPHEN 5-325 MG PO TABS
1.0000 | ORAL_TABLET | Freq: Four times a day (QID) | ORAL | Status: DC | PRN
  Administered 2023-03-06: 1 via ORAL
  Filled 2023-03-06: qty 1

## 2023-03-06 NOTE — Hospital Course (Addendum)
62 y.o.f cardioembolism are normal with no dysphagia around w the lung,PMH of chronic anxiety,Herpes simplex, GERD, hiatal hernia, migraine headaches,hyperlipidemia, PUD, bilateral thyroid nodules, overweight,stress incontinence who presented to the ED w/ c/o abdominal pain since 9/7, 2100 associated with multiple episodes of nausea/emesis/constipation x 6 days.  In ED:VS temp 98.5 F,HR 84,RR 18, BP 152/81 mmHg O2 sat 99% RA. S/p 1000 mL of NS,  ceftriaxone 2 g IVPB and metronidazole 500 mg IVPB and admitted. Labs: UA-small bilirubin and ketones of 5 mg/dL. Wbc-14.1 with 80% neutrophils, hemoglobin 13.3 g/dL platelets 401.Lipase normal.CMP-CO2 of 21 mmol/L, glucose of 123 and BUN of 30 mg/dL.The rest of the CMP measurements were normal. CT abdomen/pelvis with contrast>>mild diverticulosis of the descending and sigmoid colon,wall thickening and inflammatory changes involving the ascending and sigmoid colon most consistent with infectious or inflammatory colitis given the more diffuse nature of this inflammation. Diverticulitis cannot be excluded,Large amount of stool seen throughout the colon.   Managed w/ ivf antibiotics, diet advanced slowly and tolerating well and she feels ready for discharge, no more abdomen pain. Advised to follow up with GI as OUTPATIENT SOON.

## 2023-03-06 NOTE — Progress Notes (Signed)
PROGRESS NOTE Audrey Serrano  ZOX:[id number] DOB: Jan 14, 1961 DOA: 03/05/2023 PCP: Harvel Quale, DO  Brief Narrative/Hospital Course: 62 y.o.f cardioembolism are normal with no dysphagia around w the lung,PMH of chronic anxiety,Herpes simplex, GERD, hiatal hernia, migraine headaches,hyperlipidemia, PUD, bilateral thyroid nodules, overweight,stress incontinence who presented to the ED w/ c/o abdominal pain since 9/7, 2100 associated with multiple episodes of nausea/emesis/constipation x 6 days.  In ED:VS temp 98.5 F,HR 84,RR 18, BP 152/81 mmHg O2 sat 99% RA. S/p 1000 mL of NS,  ceftriaxone 2 g IVPB and metronidazole 500 mg IVPB and admitted. Labs: UA-small bilirubin and ketones of 5 mg/dL. Wbc-14.1 with 80% neutrophils, hemoglobin 13.3 g/dL platelets 811.Lipase normal.CMP-CO2 of 21 mmol/L, glucose of 123 and BUN of 30 mg/dL.The rest of the CMP measurements were normal. CT abdomen/pelvis with contrast>>mild diverticulosis of the descending and sigmoid colon,wall thickening and inflammatory changes involving the ascending and sigmoid colon most consistent with infectious or inflammatory colitis given the more diffuse nature of this inflammation. Diverticulitis cannot be excluded,Large amount of stool seen throughout the colon.       Subjective: Patient seen and examined this morning She feels some better yesterday less painful on her lower half of her abdominal Had a small BM this am and not loose. Overnight BP stable labs with leukocytosis resolved   Assessment and Plan: Principal Problem:   Acute colitis Active Problems:   Hypercholesterolemia   Migraine   Constipation   Essential hypertension   GERD without esophagitis   Lumbar spine pain   Anxiety and depression   Vitamin D deficiency   Aortic atherosclerosis (HCC)   Acute colitis of ascending and sigmoid colon Constipation: Cont rocephin/flagyl, prn antiemetics and analgesics, follow-up culture data.  Reports her colonoscopy was  this year and normal at [facility]. Started on senokot 1 tablet bid, prn miralax.  On clear liquid diet, advance as tolerated slowly.  Continue IV fluids. Can check stool studies if has diarrhea   HLD/Aortic atherosclerosis: Continue rosuvastatin 40 mg /zetia  Migraine: stable  Essential hypertension: BP stable on losartan  GERD without esophagitis: Cont PPI.   Anxiety and depression: Cont bupropion XL 300 mg p.o. at bedtime.   Lumbar spine pain: Cont pain meds, hold oral diclofenac for now. Ketorolac 50 mg p.o. twice daily x 4 doses.  Vitamin D deficiency: Cont fiber as needed.  DVT prophylaxis: enoxaparin (LOVENOX) injection 40 mg Start: 03/05/23 2200 Code Status:   Code Status: Full Code Family Communication: plan of care discussed with patient at bedside. Patient status is: Inpatient because of acute colitis Level of care: Med-Surg   Dispo: The patient is from: home            Anticipated disposition: Anticipate discharge tomorrow in Objective: Vitals last 24 hrs: Vitals:   03/05/23 2043 03/06/23 0153 03/06/23 0506 03/06/23 0507  BP: (!) 112/58 109/65 (!) 116/59 (!) 116/59  Pulse: 69 65 65 71  Resp: 18 18 16 16   Temp: 98.6 F (37 C) 98.6 F (37 C) 98.8 F (37.1 C) 98.8 F (37.1 C)  TempSrc: Oral Oral Oral Oral  SpO2: 96% 98% 97% 95%  Weight:      Height:       Weight change:   Physical Examination: General exam: alert awake x3, pleasant HEENT:Oral mucosa moist, Ear/Nose WNL grossly Respiratory system: bilaterally clear BS, no use of accessory muscle Cardiovascular system: S1 & S2 +, No JVD. Gastrointestinal system: Abdomen soft, tender in lower abdomen, bowel sounds present nondistended  Nervous System:Alert, awake, moving extremities. Extremities: LE edema neg,distal peripheral pulses palpable.  Skin: No rashes,no icterus. MSK: Normal muscle bulk,tone, power  Medications reviewed:  Scheduled Meds:  aspirin EC  81 mg Oral QHS   buPROPion  300  mg Oral QHS   cyanocobalamin  1,000 mcg Oral Daily   enoxaparin (LOVENOX) injection  40 mg Subcutaneous Q24H   ezetimibe  10 mg Oral QHS   ketorolac  15 mg Intravenous BID   losartan  50 mg Oral QHS   pantoprazole  40 mg Oral QHS   rosuvastatin  20 mg Oral QHS   senna-docusate  1 tablet Oral BID  Continuous Infusions:  sodium chloride     cefTRIAXone (ROCEPHIN)  IV     metronidazole 500 mg (03/06/23 0915)    Diet Order             Diet clear liquid Fluid consistency: Thin  Diet effective now                  Intake/Output Summary (Last 24 hours) at 03/06/2023 0930 Last data filed at 03/06/2023 0600 Gross per 24 hour  Intake 2390 ml  Output --  Net 2390 ml   Net IO Since Admission: 2,390 mL [03/06/23 0930]  Wt Readings from Last 3 Encounters:  03/05/23 77.1 kg  01/04/17 82.1 kg  11/22/11 74.8 kg     Unresulted Labs (From admission, onward)     Start     Ordered   03/06/23 0500  HIV Antibody (routine testing w rflx)  (HIV Antibody (Routine testing w reflex) panel)  Tomorrow morning,   R        03/05/23 0956          Data Reviewed: I have personally reviewed following labs and imaging studies CBC: Recent Labs  Lab 03/05/23 0706 03/06/23 0436  WBC 14.1* 8.9  NEUTROABS 11.3*  --   HGB 13.3 10.0*  HCT 40.2 31.3*  MCV 94.4 97.2  PLT 371 249   Basic Metabolic Panel: Recent Labs  Lab 03/05/23 0706 03/06/23 0436  NA 136 136  K 3.7 3.9  CL 104 109  CO2 21* 21*  GLUCOSE 123* 95  BUN 30* 16  CREATININE 0.95 0.73  CALCIUM 9.3 8.0*  MG 2.7*  --    GFR: Estimated Creatinine Clearance: 73.9 mL/min (by C-G formula based on SCr of 0.73 mg/dL). Liver Function Tests: Recent Labs  Lab 03/05/23 0706 03/06/23 0436  AST 22 12*  ALT 30 17  ALKPHOS 51 39  BILITOT 0.7 0.6  PROT 7.5 5.4*  ALBUMIN 4.9 3.3*   Recent Labs  Lab 03/05/23 0706  LIPASE 39   No results found for this or any previous visit (from the past 240 hour(s)).   Antimicrobials: Anti-infectives (From admission, onward)    Start     Dose/Rate Route Frequency Ordered Stop   03/06/23 1000  cefTRIAXone (ROCEPHIN) 2 g in sodium chloride 0.9 % 100 mL IVPB        2 g 200 mL/hr over 30 Minutes Intravenous Every 24 hours 03/05/23 0949     03/05/23 2200  metroNIDAZOLE (FLAGYL) IVPB 500 mg        500 mg 100 mL/hr over 60 Minutes Intravenous Every 12 hours 03/05/23 0949     03/05/23 0930  cefTRIAXone (ROCEPHIN) 2 g in sodium chloride 0.9 % 100 mL IVPB       Placed in "And" Linked Group   2 g 200 mL/hr over  30 Minutes Intravenous  Once 03/05/23 0919 03/05/23 1025   03/05/23 0930  metroNIDAZOLE (FLAGYL) IVPB 500 mg       Placed in "And" Linked Group   500 mg 100 mL/hr over 60 Minutes Intravenous  Once 03/05/23 0919 03/05/23 1136      Culture/Microbiology No results found for: "SDES", "SPECREQUEST", "CULT", "REPTSTATUS"   Radiology Studies: CT ABDOMEN PELVIS W CONTRAST  Result Date: 03/05/2023 CLINICAL DATA:  Acute lower abdominal pain.  Nausea, vomiting. EXAM: CT ABDOMEN AND PELVIS WITH CONTRAST TECHNIQUE: Multidetector CT imaging of the abdomen and pelvis was performed using the standard protocol following bolus administration of intravenous contrast. RADIATION DOSE REDUCTION: This exam was performed according to the departmental dose-optimization program which includes automated exposure control, adjustment of the mA and/or kV according to patient size and/or use of iterative reconstruction technique. CONTRAST:  OMNIPAQUE IOHEXOL 300 MG/ML  SOLN COMPARISON:  August 21, 2012. FINDINGS: Lower chest: No acute abnormality. Hepatobiliary: No focal liver abnormality is seen. No gallstones, gallbladder wall thickening, or biliary dilatation. Pancreas: Unremarkable. No pancreatic ductal dilatation or surrounding inflammatory changes. Spleen: Normal in size without focal abnormality. Adrenals/Urinary Tract: Adrenal glands are unremarkable. Kidneys are normal,  without renal calculi, focal lesion, or hydronephrosis. Bladder is unremarkable. Stomach/Bowel: Stomach and appendix are unremarkable. There is no evidence of small bowel obstruction. Stool is noted throughout the colon. Wall thickening of descending and sigmoid colon is noted consistent with infectious or inflammatory colitis. Mild diverticulosis of descending and sigmoid colon is noted. Small amount of inflammatory fluid is seen adjacent to the sigmoid colon most likely due to inflammation. Vascular/Lymphatic: Aortic atherosclerosis. No enlarged abdominal or pelvic lymph nodes. Reproductive: Uterus is not well visualized. No definite adnexal abnormality is noted. Other: No hernia is noted. Musculoskeletal: Status post left total hip arthroplasty. No acute osseous abnormality is noted. Status post L1 kyphoplasty. IMPRESSION: Mild diverticulosis of descending and sigmoid colon is noted. There is also noted wall thickening and inflammatory changes involving descending and sigmoid colon most consistent with infectious or inflammatory colitis given the more diffuse nature of this inflammation. Diverticulitis cannot be excluded, although no definite inflamed diverticula is noted. Large amount of stool seen throughout the colon. Aortic Atherosclerosis (ICD10-I70.0). Electronically Signed   By: Lupita Raider M.D.   On: 03/05/2023 09:04     LOS: 1 day   Lanae Boast, MD [facility]  03/06/2023, 9:30 AM

## 2023-03-07 DIAGNOSIS — K529 Noninfective gastroenteritis and colitis, unspecified: Secondary | ICD-10-CM | POA: Diagnosis not present

## 2023-03-07 MED ORDER — SENNOSIDES-DOCUSATE SODIUM 8.6-50 MG PO TABS: 1.0000 | ORAL_TABLET | Freq: Two times a day (BID) | ORAL | 0 refills | Status: AC

## 2023-03-07 MED ORDER — CEFDINIR 300 MG PO CAPS: 300.0000 mg | ORAL_CAPSULE | Freq: Two times a day (BID) | ORAL | 0 refills | Status: AC

## 2023-03-07 MED ORDER — HYDROCODONE-ACETAMINOPHEN 5-325 MG PO TABS
1.0000 | ORAL_TABLET | Freq: Four times a day (QID) | ORAL | 0 refills | Status: AC | PRN
Start: 2023-03-07 — End: 2023-03-17

## 2023-03-07 MED ORDER — METRONIDAZOLE 500 MG PO TABS: 500.0000 mg | ORAL_TABLET | Freq: Three times a day (TID) | ORAL | 0 refills | Status: AC

## 2023-03-07 NOTE — Progress Notes (Signed)
Discharge instructions discussed with patient, verbalized agreement and understanding 

## 2023-03-07 NOTE — Discharge Summary (Signed)
Physician Discharge Summary  Audrey Serrano ZOX:[id number] DOB: [birth year] DOA: 03/05/2023  PCP: Harvel Quale, DO  Admit date: 03/05/2023 Discharge date: 03/07/2023 Recommendations for Outpatient Follow-up:  Follow up with PCP and GI in 1 weeks-call for appointment Please obtain BMP/CBC in one week  Discharge Dispo: Home Discharge Condition: Stable Code Status:   Code Status: Full Code Diet recommendation:  Diet Order             DIET SOFT Room service appropriate? Yes; Fluid consistency: Thin  Diet effective now                    Brief/Interim Summary: 62 y.o.f cardioembolism are normal with no dysphagia around w the lung,PMH of chronic anxiety,Herpes simplex, GERD, hiatal hernia, migraine headaches,hyperlipidemia, PUD, bilateral thyroid nodules, overweight,stress incontinence who presented to the ED w/ c/o abdominal pain since 9/7, 2100 associated with multiple episodes of nausea/emesis/constipation x 6 days.  In ED:VS temp 98.5 F,HR 84,RR 18, BP 152/81 mmHg O2 sat 99% RA. S/p 1000 mL of NS,  ceftriaxone 2 g IVPB and metronidazole 500 mg IVPB and admitted. Labs: UA-small bilirubin and ketones of 5 mg/dL. Wbc-14.1 with 80% neutrophils, hemoglobin 13.3 g/dL platelets 811.Lipase normal.CMP-CO2 of 21 mmol/L, glucose of 123 and BUN of 30 mg/dL.The rest of the CMP measurements were normal. CT abdomen/pelvis with contrast>>mild diverticulosis of the descending and sigmoid colon,wall thickening and inflammatory changes involving the ascending and sigmoid colon most consistent with infectious or inflammatory colitis given the more diffuse nature of this inflammation. Diverticulitis cannot be excluded,Large amount of stool seen throughout the colon.   Managed w/ ivf antibiotics, diet advanced slowly and tolerating well and she feels ready for discharge, no more abdomen pain. Advised to follow up with GI as OUTPATIENT SOON.   Discharge Diagnoses:  Principal Problem:   Acute colitis Active  Problems:   Hypercholesterolemia   Migraine   Constipation   Essential hypertension   GERD without esophagitis   Lumbar spine pain   Anxiety and depression   Vitamin D deficiency   Aortic atherosclerosis (HCC)  Acute colitis of ascending and sigmoid colon Constipation: Pain overall much improved and feels better. leukocytosis resolved.  Continue PO ANTIBIOTICS , tolerating diet and feels ready for discharge.Reports her colonoscopy was within this year and normal at [facility].   HLD/Aortic atherosclerosis: Continue rosuvastatin 40 mg /zetia  Migraine: stable  Essential hypertension: BP stable on losartan  GERD without esophagitis: Cont PPI.   Anxiety and depression: Cont bupropion XL 300 mg p.o. at bedtime.   Lumbar spine pain: Cont pain meds  Vitamin D deficiency: Cont fiber as needed.    Consults: None  Subjective: Aaox3 pain improved.  Discharge Exam: Vitals:   03/07/23 0555 03/07/23 1338  BP: 122/72 133/63  Pulse: 64 76  Resp: 14 18  Temp: 98.8 F (37.1 C) 99 F (37.2 C)  SpO2: 97% 99%   General: Pt is alert, awake, not in acute distress Cardiovascular: RRR, S1/S2 +, no rubs, no gallops Respiratory: CTA bilaterally, no wheezing, no rhonchi Abdominal: Soft, NT, ND, bowel sounds + Extremities: no edema, no cyanosis  Discharge Instructions  Discharge Instructions     Discharge instructions   Complete by: As directed    Please call call MD or return to ER for similar or worsening recurring problem that brought you to hospital or if any fever,nausea/vomiting,abdominal pain, uncontrolled pain, chest pain,  shortness of breath or any other alarming symptoms.  Please follow-up your doctor  as instructed in a week time and call the office for appointment.  Please avoid alcohol, smoking, or any other illicit substance and maintain healthy habits including taking your regular medications as prescribed.  You were cared for by a hospitalist during your  hospital stay. If you have any questions about your discharge medications or the care you received while you were in the hospital after you are discharged, you can call the unit and ask to speak with the hospitalist on call if the hospitalist that took care of you is not available.  Once you are discharged, your primary care physician will handle any further medical issues. Please note that NO REFILLS for any discharge medications will be authorized once you are discharged, as it is imperative that you return to your primary care physician (or establish a relationship with a primary care physician if you do not have one) for your aftercare needs so that they can reassess your need for medications and monitor your lab values   Increase activity slowly   Complete by: As directed       Allergies as of 03/07/2023       Reactions   Antihistamines, Chlorpheniramine-type    Pt stated, "Makes me nervous, excitable"   Macrobid [nitrofurantoin] Nausea And Vomiting   Other Other (See Comments)   Cause hyper activeness Oral Steroids causes altered mental state.  Injections are fine   Prednisone Other (See Comments)   agitation, nausea Other reaction(s): Confusion        Medication List     TAKE these medications    acetaminophen 325 MG tablet Commonly known as: TYLENOL Take 650 mg by mouth every 6 (six) hours as needed for mild pain or moderate pain.   aspirin 81 MG tablet Take 81 mg by mouth at bedtime.   buPROPion 150 MG 24 hr tablet Commonly known as: WELLBUTRIN XL Take 300 mg by mouth at bedtime.   cefdinir 300 MG capsule Commonly known as: OMNICEF Take 1 capsule (300 mg total) by mouth 2 (two) times daily for 5 days.   cholecalciferol 25 MCG (1000 UNIT) tablet Commonly known as: VITAMIN D3 Take 1 capsule by mouth daily.   Crestor 40 MG tablet Generic drug: rosuvastatin Take 20 mg by mouth at bedtime.   cyanocobalamin 1000 MCG tablet Commonly known as: VITAMIN B12 Take 1  tablet by mouth daily.   diclofenac 75 MG EC tablet Commonly known as: VOLTAREN Take 75 mg by mouth daily.   ezetimibe 10 MG tablet Commonly known as: ZETIA Take 10 mg by mouth at bedtime.   famotidine 40 MG tablet Commonly known as: PEPCID Take 40 mg by mouth daily as needed for heartburn or indigestion.   HYDROcodone-acetaminophen 5-325 MG tablet Commonly known as: NORCO/VICODIN Take 1-2 tablets by mouth every 6 (six) hours as needed for up to 10 days for moderate pain or severe pain.   losartan 50 MG tablet Commonly known as: COZAAR   methocarbamol 500 MG tablet Commonly known as: ROBAXIN Take 500 mg by mouth 3 (three) times daily.   metroNIDAZOLE 500 MG tablet Commonly known as: Flagyl Take 1 tablet (500 mg total) by mouth 3 (three) times daily for 5 days.   pantoprazole 40 MG tablet Commonly known as: PROTONIX Take 40 mg by mouth at bedtime.   senna-docusate 8.6-50 MG tablet Commonly known as: Senokot-S Take 1 tablet by mouth 2 (two) times daily for 14 days.   zolpidem 5 MG tablet Commonly known as: AMBIEN Take  2.5 mg by mouth at bedtime as needed for sleep.        Follow-up Information     Lazoff, Remi Deter A, DO Follow up in 1 week(s).   Specialty: Nephrology Contact information: [address] [place] [address] [phone]                Allergies  Allergen Reactions   Antihistamines, Chlorpheniramine-Type     Pt stated, "Makes me nervous, excitable"   Macrobid [Nitrofurantoin] Nausea And Vomiting   Other Other (See Comments)    Cause hyper activeness Oral Steroids causes altered mental state.  Injections are fine   Prednisone Other (See Comments)    agitation, nausea Other reaction(s): Confusion    The results of significant diagnostics from this hospitalization (including imaging, microbiology, ancillary and laboratory) are listed below for reference.    Microbiology: No results found for this or any previous visit  (from the past 240 hour(s)).  Procedures/Studies: CT ABDOMEN PELVIS W CONTRAST  Result Date: 03/05/2023 CLINICAL DATA:  Acute lower abdominal pain.  Nausea, vomiting. EXAM: CT ABDOMEN AND PELVIS WITH CONTRAST TECHNIQUE: Multidetector CT imaging of the abdomen and pelvis was performed using the standard protocol following bolus administration of intravenous contrast. RADIATION DOSE REDUCTION: This exam was performed according to the departmental dose-optimization program which includes automated exposure control, adjustment of the mA and/or kV according to patient size and/or use of iterative reconstruction technique. CONTRAST:  OMNIPAQUE IOHEXOL 300 MG/ML  SOLN COMPARISON:  August 21, 2012. FINDINGS: Lower chest: No acute abnormality. Hepatobiliary: No focal liver abnormality is seen. No gallstones, gallbladder wall thickening, or biliary dilatation. Pancreas: Unremarkable. No pancreatic ductal dilatation or surrounding inflammatory changes. Spleen: Normal in size without focal abnormality. Adrenals/Urinary Tract: Adrenal glands are unremarkable. Kidneys are normal, without renal calculi, focal lesion, or hydronephrosis. Bladder is unremarkable. Stomach/Bowel: Stomach and appendix are unremarkable. There is no evidence of small bowel obstruction. Stool is noted throughout the colon. Wall thickening of descending and sigmoid colon is noted consistent with infectious or inflammatory colitis. Mild diverticulosis of descending and sigmoid colon is noted. Small amount of inflammatory fluid is seen adjacent to the sigmoid colon most likely due to inflammation. Vascular/Lymphatic: Aortic atherosclerosis. No enlarged abdominal or pelvic lymph nodes. Reproductive: Uterus is not well visualized. No definite adnexal abnormality is noted. Other: No hernia is noted. Musculoskeletal: Status post left total hip arthroplasty. No acute osseous abnormality is noted. Status post L1 kyphoplasty. IMPRESSION: Mild  diverticulosis of descending and sigmoid colon is noted. There is also noted wall thickening and inflammatory changes involving descending and sigmoid colon most consistent with infectious or inflammatory colitis given the more diffuse nature of this inflammation. Diverticulitis cannot be excluded, although no definite inflamed diverticula is noted. Large amount of stool seen throughout the colon. Aortic Atherosclerosis (ICD10-I70.0). Electronically Signed   By: Lupita Raider M.D.   On: 03/05/2023 09:04    Labs: BNP (last 3 results) No results for input(s): "BNP" in the last 8760 hours. Basic Metabolic Panel: Recent Labs  Lab 03/05/23 0706 03/06/23 0436  NA 136 136  K 3.7 3.9  CL 104 109  CO2 21* 21*  GLUCOSE 123* 95  BUN 30* 16  CREATININE 0.95 0.73  CALCIUM 9.3 8.0*  MG 2.7*  --    Liver Function Tests: Recent Labs  Lab 03/05/23 0706 03/06/23 0436  AST 22 12*  ALT 30 17  ALKPHOS 51 39  BILITOT 0.7 0.6  PROT 7.5 5.4*  ALBUMIN 4.9 3.3*   Recent Labs  Lab 03/05/23 0706  LIPASE 39   No results for input(s): "AMMONIA" in the last 168 hours. CBC: Recent Labs  Lab 03/05/23 0706 03/06/23 0436  WBC 14.1* 8.9  NEUTROABS 11.3*  --   HGB 13.3 10.0*  HCT 40.2 31.3*  MCV 94.4 97.2  PLT 371 249    Anemia work up No results for input(s): "VITAMINB12", "FOLATE", "FERRITIN", "TIBC", "IRON", "RETICCTPCT" in the last 72 hours. Urinalysis    Component Value Date/Time   COLORURINE AMBER (A) 03/05/2023 0638   APPEARANCEUR CLEAR 03/05/2023 0638   LABSPEC 1.026 03/05/2023 0638   PHURINE 5.0 03/05/2023 0638   GLUCOSEU NEGATIVE 03/05/2023 0638   HGBUR NEGATIVE 03/05/2023 0638   BILIRUBINUR SMALL (A) 03/05/2023 0638   KETONESUR 5 (A) 03/05/2023 0638   PROTEINUR NEGATIVE 03/05/2023 0638   NITRITE NEGATIVE 03/05/2023 0638   LEUKOCYTESUR NEGATIVE 03/05/2023 0638   Sepsis Labs Recent Labs  Lab 03/05/23 0706 03/06/23 0436  WBC 14.1* 8.9   Microbiology No results found  for this or any previous visit (from the past 240 hour(s)).   Time coordinating discharge: 25 minutes  SIGNED: Lanae Boast, MD  [facility] 03/07/2023, 4:22 PM  If 7PM-7AM, please contact night-coverage [email or web address]

## 2023-03-07 NOTE — Plan of Care (Signed)
  Problem: Clinical Measurements: Goal: Diagnostic test results will improve Outcome: Progressing   Problem: Activity: Goal: Risk for activity intolerance will decrease Outcome: Progressing
